# Patient Record
Sex: Male | Born: 1970 | Race: White | Hispanic: No | Marital: Married | Smoking: Former smoker
Health system: Southern US, Community
[De-identification: ages and names within clinical notes are randomized; demographics above are authoritative.]

## PROBLEM LIST (undated history)

## (undated) DIAGNOSIS — N200 Calculus of kidney: Secondary | ICD-10-CM

## (undated) DIAGNOSIS — K859 Acute pancreatitis without necrosis or infection, unspecified: Secondary | ICD-10-CM

## (undated) DIAGNOSIS — E119 Type 2 diabetes mellitus without complications: Secondary | ICD-10-CM

## (undated) DIAGNOSIS — M549 Dorsalgia, unspecified: Secondary | ICD-10-CM

## (undated) HISTORY — PX: ERCP: SHX60

## (undated) HISTORY — PX: PANCREAS SURGERY: SHX731

---

## 2004-07-28 ENCOUNTER — Emergency Department (HOSPITAL_COMMUNITY): Admission: EM | Admit: 2004-07-28 | Discharge: 2004-07-28 | Payer: Self-pay | Admitting: Emergency Medicine

## 2005-09-13 ENCOUNTER — Encounter: Admission: RE | Admit: 2005-09-13 | Discharge: 2005-09-13 | Payer: Self-pay | Admitting: Gastroenterology

## 2006-04-18 ENCOUNTER — Ambulatory Visit: Payer: Self-pay | Admitting: Physical Medicine & Rehabilitation

## 2006-04-18 ENCOUNTER — Encounter
Admission: RE | Admit: 2006-04-18 | Discharge: 2006-07-17 | Payer: Self-pay | Admitting: Physical Medicine & Rehabilitation

## 2006-08-11 ENCOUNTER — Encounter
Admission: RE | Admit: 2006-08-11 | Discharge: 2006-11-09 | Payer: Self-pay | Admitting: Physical Medicine & Rehabilitation

## 2006-11-03 ENCOUNTER — Ambulatory Visit: Payer: Self-pay | Admitting: Physical Medicine & Rehabilitation

## 2007-02-20 ENCOUNTER — Encounter
Admission: RE | Admit: 2007-02-20 | Discharge: 2007-02-20 | Payer: Self-pay | Admitting: Physical Medicine & Rehabilitation

## 2007-06-18 ENCOUNTER — Ambulatory Visit: Payer: Self-pay | Admitting: Physical Medicine & Rehabilitation

## 2007-06-18 ENCOUNTER — Encounter
Admission: RE | Admit: 2007-06-18 | Discharge: 2007-06-19 | Payer: Self-pay | Admitting: Physical Medicine & Rehabilitation

## 2007-11-11 ENCOUNTER — Encounter
Admission: RE | Admit: 2007-11-11 | Discharge: 2007-11-13 | Payer: Self-pay | Admitting: Physical Medicine & Rehabilitation

## 2007-11-13 ENCOUNTER — Ambulatory Visit: Payer: Self-pay | Admitting: Physical Medicine & Rehabilitation

## 2008-05-04 ENCOUNTER — Encounter
Admission: RE | Admit: 2008-05-04 | Discharge: 2008-05-05 | Payer: Self-pay | Admitting: Physical Medicine & Rehabilitation

## 2008-05-05 ENCOUNTER — Ambulatory Visit: Payer: Self-pay | Admitting: Physical Medicine & Rehabilitation

## 2010-10-02 NOTE — Assessment & Plan Note (Signed)
Curtis Spears returns to the clinic today for followup evaluation.  He  reports that he is having good relief from his pancreatic pain.  He  reports that he had a stent placed in December 2007 and that was removed  in March 2008.  He reports that he has only had one mild attack since  that time and that lasted approximately 3 days.  He took approximately  15 tablets over those 3 days but since that time has had no use of the  Percocet.  He reports that he would like to try Naprosyn on a regular  basis as he has had some benefit from that in terms of pressure in his  neck related to nerve pain previously.  He also reports that his blood  sugars have been mostly in the 100 range but occasionally as high as 300  when he has irregular diet or irregular sleeping patterns.  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   MEDICATIONS:  1. Aspirin 325 mg daily.  2. Pangestyme 20,000 units two tablets t.i.d.  3. Metformin 1000 mg daily.  4. Glimepiride 2 mg one tablet daily.  5. Oxycodone 10/325 one to two tablets p.o. q.i.d. p.r.n. (0-2 per      week).  6. Excedrin Migraine p.r.n.   REVIEW OF SYSTEMS:  Positive for weight gain, diarrhea, constipation,  and abdominal pain.   PHYSICAL EXAMINATION:  Well-appearing, fit adult male in mild to no  acute discomfort.  Blood pressure was 133/75 with a pulse of 68,  respiratory rate 18, and O2 saturation 99% on room air.  He has 5/5  strength throughout the bilateral upper and lower extremities.   IMPRESSION:  1. History of familial calcific pancreatitis with occasional      exacerbations.  2. Narcotic dependency during pancreatic episodes.   In the office today we did give the patient a new prescription for  Naprosyn 375 mg one tablet p.o. b.i.d. p.r.n.  We also refilled his  Percocet as he is running out of the last prescription given to him in  February.  We will plan on seeing the patient in  followup in  approximately 3-4 months' time.  He still is interested in possible  biofeedback in the future but will contact this office when his schedule  permits.           ______________________________  Curtis Spears, M.D.     DC/MedQ  D:  11/03/2006 11:52:51  T:  11/03/2006 14:11:26  Job #:  [id number]

## 2010-10-02 NOTE — Assessment & Plan Note (Signed)
Curtis Spears returns to clinic today for followup evaluation.  He reports  that he started to have severe back ache a few weeks ago especially  after a strong sneeze that he tried to hold back.  He reports that he  started to have spasms of his back muscles and eventually was seen by  [facility].  They apparently found persistent pars fracture  probably related to sports such as football that he had engaged it in  the past.  In an event, he was started on Skelaxin and continued on  oxycodone along with Naprosyn.  He reports that with the increased back  pain he has had to limit his traveling at his job, but he still works  full time.  He is attending therapy twice a week and that has helped  substantially.  The patient reports that he has had no bout of  pancreatitis recently.  He reports that his blood sugars remain elevated  and they are trying to adjust his medicines for that problem.   MEDICATIONS:  1. Aspirin 325 mg.  2. Pangestyme 20,000 units 2 tablets t.i.d.  3. Metformin 1000 mg b.i.d.  4. Glimepiride 2 mg 1-1/2 tablets daily.  5. Oxycodone 10/325, 1-2 tablets p.o. b.i.d. p.r.n.  6. Excedrin Migraine p.r.n.  7. Skelaxin 800 mg b.i.d.  8. Naprosyn 375 mg b.i.d.   REVIEW OF SYSTEMS:  Positive for constipation, abdominal pain, nausea,  high blood sugar, and weight gain.   PHYSICAL EXAMINATION:  GENERAL:  A reasonably well-appearing middle-aged  adult male in mild acute discomfort involving his back.  VITAL SIGNS:  Vitals were not obtained in the office today.  EXTREMITIES:  He has 5/5 strength throughout the bilateral upper and  lower extremities.  Lumbar range of motion was slightly decreased in  flexion and extension.   IMPRESSION:  1. History of familial  calcific pancreatitis with occasional      exacerbations.  2. Narcotic dependency during pancreatic episodes.  3. L5 pars fracture identified on recent x-ray.   At the present time, we have refilled the  patient's Skelaxin along with  his Naprosyn and oxycodone.  He continues to get good analgesic effect  from his oxycodone for his pancreatitis and now it is being helpful for  his back pain.  Additionally, the muscle relaxer and the  antiinflammatory medication along with therapy have helped.  He reports  that overall he is doing better than he was few weeks ago and plans to  resume his regular work activities over the next several weeks.  We will  plan on seeing the patient in followup in approximately 4-6 months' time  or before as necessary.           ______________________________  Curtis Spears, M.D.     DC/MedQ  D:  05/06/2008 11:19:01  T:  05/07/2008 02:57:47  Job #:  [id number]

## 2010-10-02 NOTE — Assessment & Plan Note (Signed)
Curtis Spears returns to the clinic today accompanied by his wife.  Overall, he is doing fairly well.  He has developed some increased mid  back pain, which his primary care physician did x-rays for.  He was told  that he had some arthritis, but he feels that it is more muscle related.  He does use a TENS unit and gets some benefit.  He had tried Flexeril,  but that made him too sleepy.   In terms of his pancreatitis, he last had an attack approximately 3-4  weeks ago and that lasted approximately 7 days.  He did have to use  approximately 6 Percocet per day during those episodes, but generally he  goes months without having to use the Percocet.  He reports that his  hemoglobin A1c was recently elevated at 11 and his diabetes medicines  were adjusted.  He is still feeling that he may eventually have to go on  insulin.  He does not need a refill on his Percocet in the office today.   MEDICATIONS:  1. Aspirin 325 mg daily.  2. Pangestyme 20,000 units 2 tablets t.i.d.  3. Metformin 1000 mg b.i.d.  4. Glimepiride 2 mg one and a half tablet daily.  5. Oxycodone 10/325 mg 1-2 tablets p.o. q.i.d. p.r.n. (0-2 per week).  6. Excedrin Migraine p.r.n.   REVIEW OF SYSTEMS:  Noncontributory.   PHYSICAL EXAMINATION:  GENERAL:  Well-appearing and well-nourished adult  male in mild-to-no acute discomfort.  VITAL SIGNS:  Not obtained in the office today.  NEUROLOGIC:  He has 5/5 strength throughout the bilateral upper and  lower extremities.  He ambulates without any assistive device.   IMPRESSION:  1. History of familial calcific pancreatitis with occasional      exacerbation.  2. Narcotic dependency during pancreatic episodes.  3. Mild mid back pain.   PLAN:  In the office today no refill on medication is necessary.  We did  give him samples of Skelaxin 800 mg to be used q.i.d. p.r.n.  He can try  this to see if he tolerates the medication and it gives him any benefit  from his back pain.   Otherwise, he will be using his TENS unit along  with heat for that problem.  He will call in for refill on his Percocet  as necessary.  We will plan on seeing him at followup in approximately 6  months' time.           ______________________________  Ellwood Dense, M.D.     DC/MedQ  D:  11/13/2007 12:25:31  T:  11/14/2007 16:10:96  Job #:  [id number]

## 2010-10-02 NOTE — Assessment & Plan Note (Signed)
DATE OF EVALUATION:  06/19/2007   Mr. Scalzo returns to clinic today for followup evaluation.  He was last  seen in this office November 03, 2006.  He has been continuing to [place] the  oxycodone 10/325 approximately 0-2 per week.  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  He had a small attack around Christmas time, but otherwise has  been doing well.  He is out of the oxycodone at this point, having last  filled that prescription approximately March 23, 2007.   The patient reports that his blood sugars have been reasonably well  controlled, although he is due to follow up with the physician's  assistant in Dr. Landry Dyke office over the next few weeks.   MEDICATIONS:  1. Aspirin 325 mg.  2. Pangestyme 20,000 units 2 tablets t.i.d..  3. Metformin 1000 mg daily.  4. Glimepiride 2 mg 1 tablet.  5. Oxycodone 10/325 one to two tablets p.o. q.i.d. p.r.n. (0-2 per      week).  6. Excedrin Migraine p.r.n.   REVIEW OF SYSTEMS:  Noncontributory.   PHYSICAL EXAMINATION:  Well appearing, thin, adult male in mild to no  acute discomfort.  Blood pressure 150/67, pulse 93, respiratory rate 18  and O2 saturation 97% on room air.  He has 5/5 strength throughout the  bilateral upper and lower extremities.   IMPRESSION:  1. History of familial calcific pancreatitis with occasional      exacerbations.  2. Narcotic dependency during pancreatic episodes.   In the office today, we did refill the patient's oxycodone 10/325, a  total of 120 as of today, June 19, 2007.  We will plan on seeing him  in followup in approximately six moths' time.  He continues to get good  analgesic effect when he actually needs the medicine and he has few  exacerbations at present.  He has had no hospitalizations over the past  several months.  We will plan on seeing him in followup as noted above.           ______________________________  Ellwood Dense,  M.D.     DC/MedQ  D:  06/19/2007 12:41:02  T:  06/19/2007 16:10:96  Job #:  [id number]

## 2010-10-05 NOTE — Group Therapy Note (Signed)
Friday, April 18, 2006:   Purpose of evaluation:  Evaluate and treat chronic pancreatic pain.   HISTORY OF PRESENT ILLNESS:  Curtis Spears is a 40 year old adult male  referred to this office by his GI specialist for evaluation and  treatment of chronic pancreatic pain. The patient reports a history of  pancreatic pain since he was a small child, and also there is a strong  family history of hereditary familial pancreatitis. His father has  suffered for years and underwent an pancreatectomy in the recent past.  The patient has been followed by Dr. Evette Cristal and occasionally Dr. Fredric Mare  for periodic ERCPs. These procedures have been done for stone  extractions starting in 1991, 1998, and most recently in 2004.   The patient reports that his episodes generally last 1 1/2 to 2 weeks at  the present time. They occur every two to three months. On days when he  is not having attacks, he is able to function well, taking his  pancreatic enzymes and working outside the home. On times when he has on  days, when he has attacks, he generally takes up to eight tablets of the  Percocet in a 24-hour period. He generally has a total of 60 tablets  that he gets filled for him previously by the treating physicians. He  tries to avoid any exacerbating events of foods that would bring on his  pancreatic attacks. He seems to have a good understanding now that he  has suffered with this for two to three decades.   PAST MEDICAL HISTORY:  1. History of chronic familial calcific pancreatitis followed by Dr.      Evette Cristal and Dr. Fredric Mare, diagnosed in 1970s at [facility].  2. Non-insulin dependent diabetes mellitus., Secondary to hereditary      pancreatitis.  3. History of multiple endoscopic stone extractions x3 in 1991, 1998,      and 2004.   FAMILY HISTORY:  Positive for familial pancreatitis with his father  having undergone a pancreatectomy.   ALLERGIES:  PENICILLIN.   SOCIAL HISTORY:  The patient is  married with one stepdaughter. He quit  tobacco one year ago and uses chewing gum to avoid recurrent use. He  reports rare use of alcohol in the past, with no use at present. He does  smoke a rare cigar. 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   MEDICAL DECISION MAKING:  1. Aspirin 250 mg one tablet q.d.  2. Pangestyme 20,000 units two tablets t.i.d.  3. Metformin 1000 mg two tablets q.d.  4. Glimepiride 2 mg one tablet q.d.  5. Oxycodone 10/325 one to two tablets p.o. q. 4 hours p.r.n.  6. Excedrin Migraine p.r.n.   REVIEW OF SYSTEMS:  Positive for occasional diarrhea, constipation and  abdominal pain.   PHYSICAL EXAMINATION:  Well-appearing, fit adult male, height 5 foot 11  inches, weight 215 pounds. Blood pressure is 133/60 with a pulse of 75,  respiratory rate 16, and O2 saturation 97% on room air. He has 5/5  strength throughout the bilateral upper and lower extremities. Bulk and  tone were normal. Reflexes were 2+ and symmetrical. Upper extremities  and lower extremities range of motion was full and pain-free.   IMPRESSION:  1. History of familial calcific pancreatitis with occasional      exacerbations.  2. Narcotic dependency doing pancreatitis episodes.   At the present time, I have refilled the patient's oxycodone 10/325 one  to two tablets p.o. q.i.d.  p.r.n. He requests only a total of 60 to be  available to him at the present time. He understands that he needs to  get all pain medicines through this office and that we will refill that  as needed. I asked him to give [place] several days notice so that we can  make sure he has a sufficient supply, especially when he is out on  business trips. We will plan on seeing the patient in followup in  approximately two month's time, and we will continue medications at that  point. He also is interested in possibly looking into biofeedback, which  he has used in the past. I will contact Dr. Leonides Cave when Dr.  Leonides Cave  returns from his knee replacement surgery. We will see if any  biofeedback is available in the area. The patient has been advised about  use of laxatives secondary to constipation that he develops with the use  of the Percocet. I have also encouraged him to try to maintain fluid  intake, especially when he is having pancreatic episodes. He seems to be  able to eat minimal amounts of soft food, but his p.o. intake fluids is  somewhat limited. I have encouraged him to try to keep that fluid intake  up, especially when he starts taking the Percocet. We will plan on  seeing the patient in followup in approximately two month's time.           ______________________________  Ellwood Dense, M.D.     DC/MedQ  D:  04/18/2006 13:43:36  T:  04/18/2006 15:22:40  Job #:  [id number]

## 2012-04-13 ENCOUNTER — Emergency Department (HOSPITAL_BASED_OUTPATIENT_CLINIC_OR_DEPARTMENT_OTHER): Payer: 59

## 2012-04-13 ENCOUNTER — Inpatient Hospital Stay (HOSPITAL_BASED_OUTPATIENT_CLINIC_OR_DEPARTMENT_OTHER)
Admission: EM | Admit: 2012-04-13 | Discharge: 2012-04-19 | DRG: 438 | Disposition: A | Discharge status: Home / Self Care | Payer: 59 | Attending: Internal Medicine | Admitting: Internal Medicine

## 2012-04-13 ENCOUNTER — Encounter (HOSPITAL_BASED_OUTPATIENT_CLINIC_OR_DEPARTMENT_OTHER): Payer: Self-pay | Admitting: *Deleted

## 2012-04-13 DIAGNOSIS — G92 Toxic encephalopathy: Secondary | ICD-10-CM | POA: Diagnosis not present

## 2012-04-13 DIAGNOSIS — J9601 Acute respiratory failure with hypoxia: Secondary | ICD-10-CM

## 2012-04-13 DIAGNOSIS — IMO0002 Reserved for concepts with insufficient information to code with codable children: Secondary | ICD-10-CM | POA: Diagnosis present

## 2012-04-13 DIAGNOSIS — Z880 Allergy status to penicillin: Secondary | ICD-10-CM

## 2012-04-13 DIAGNOSIS — K861 Other chronic pancreatitis: Secondary | ICD-10-CM | POA: Diagnosis present

## 2012-04-13 DIAGNOSIS — Z91041 Radiographic dye allergy status: Secondary | ICD-10-CM

## 2012-04-13 DIAGNOSIS — Z79899 Other long term (current) drug therapy: Secondary | ICD-10-CM

## 2012-04-13 DIAGNOSIS — G929 Unspecified toxic encephalopathy: Secondary | ICD-10-CM | POA: Diagnosis not present

## 2012-04-13 DIAGNOSIS — E876 Hypokalemia: Secondary | ICD-10-CM | POA: Diagnosis present

## 2012-04-13 DIAGNOSIS — G4733 Obstructive sleep apnea (adult) (pediatric): Secondary | ICD-10-CM | POA: Diagnosis present

## 2012-04-13 DIAGNOSIS — G9340 Encephalopathy, unspecified: Secondary | ICD-10-CM

## 2012-04-13 DIAGNOSIS — T40605A Adverse effect of unspecified narcotics, initial encounter: Secondary | ICD-10-CM | POA: Diagnosis not present

## 2012-04-13 DIAGNOSIS — R000 Tachycardia, unspecified: Secondary | ICD-10-CM | POA: Diagnosis present

## 2012-04-13 DIAGNOSIS — E119 Type 2 diabetes mellitus without complications: Secondary | ICD-10-CM | POA: Diagnosis present

## 2012-04-13 DIAGNOSIS — Z794 Long term (current) use of insulin: Secondary | ICD-10-CM

## 2012-04-13 DIAGNOSIS — K859 Acute pancreatitis without necrosis or infection, unspecified: Principal | ICD-10-CM | POA: Diagnosis present

## 2012-04-13 DIAGNOSIS — J9600 Acute respiratory failure, unspecified whether with hypoxia or hypercapnia: Secondary | ICD-10-CM | POA: Diagnosis not present

## 2012-04-13 DIAGNOSIS — Z87891 Personal history of nicotine dependence: Secondary | ICD-10-CM

## 2012-04-13 DIAGNOSIS — K8689 Other specified diseases of pancreas: Secondary | ICD-10-CM | POA: Diagnosis present

## 2012-04-13 HISTORY — DX: Dorsalgia, unspecified: M54.9

## 2012-04-13 HISTORY — DX: Type 2 diabetes mellitus without complications: E11.9

## 2012-04-13 HISTORY — DX: Acute pancreatitis without necrosis or infection, unspecified: K85.90

## 2012-04-13 LAB — CBC WITH DIFFERENTIAL/PLATELET
Basophils Absolute: 0 10*3/uL (ref 0.0–0.1)
Basophils Relative: 0 % (ref 0–1)
Eosinophils Absolute: 0.3 10*3/uL (ref 0.0–0.7)
Eosinophils Relative: 3 % (ref 0–5)
Lymphs Abs: 1.7 10*3/uL (ref 0.7–4.0)
Monocytes Relative: 9 % (ref 3–12)
Neutro Abs: 9 10*3/uL — ABNORMAL HIGH (ref 1.7–7.7)
WBC: 12.1 10*3/uL — ABNORMAL HIGH (ref 4.0–10.5)

## 2012-04-13 LAB — COMPREHENSIVE METABOLIC PANEL
Chloride: 104 mEq/L (ref 96–112)
Creatinine, Ser: 1 mg/dL (ref 0.50–1.35)
GFR calc non Af Amer: >90 mL/min (ref >90)
Total Bilirubin: 0.8 mg/dL (ref 0.3–1.2)
Total Protein: 7.3 g/dL (ref 6.0–8.3)

## 2012-04-13 LAB — URINALYSIS, ROUTINE W REFLEX MICROSCOPIC
Glucose, UA: NEGATIVE mg/dL
Specific Gravity, Urine: 1.029 (ref 1.005–1.030)

## 2012-04-13 LAB — LACTIC ACID, PLASMA: Lactic Acid, Venous: 6 mmol/L — ABNORMAL HIGH (ref 0.5–2.2)

## 2012-04-13 LAB — URINE MICROSCOPIC-ADD ON

## 2012-04-13 LAB — GLUCOSE, CAPILLARY: Glucose-Capillary: 107 mg/dL — ABNORMAL HIGH (ref 70–99)

## 2012-04-13 MED ORDER — SODIUM CHLORIDE 0.9 % IV BOLUS (SEPSIS)
1000.0000 mL | Freq: Once | INTRAVENOUS | Status: DC
  Administered 2012-04-14: 1000 mL via INTRAVENOUS

## 2012-04-13 MED ORDER — SODIUM CHLORIDE 0.9 % IV BOLUS (SEPSIS)
1000.0000 mL | Freq: Once | INTRAVENOUS | Status: AC
Start: 2012-04-13 — End: 2012-04-14
  Administered 2012-04-13: 1000 mL via INTRAVENOUS

## 2012-04-13 MED ORDER — HYDROMORPHONE HCL PF 1 MG/ML IJ SOLN
1.0000 mg | Freq: Once | INTRAMUSCULAR | Status: AC
  Administered 2012-04-13: 1 mg via INTRAVENOUS
  Filled 2012-04-13: qty 1

## 2012-04-13 MED ORDER — ONDANSETRON HCL 4 MG/2ML IJ SOLN
4.0000 mg | Freq: Once | INTRAMUSCULAR | Status: AC
  Administered 2012-04-13: 4 mg via INTRAVENOUS
  Filled 2012-04-13: qty 2

## 2012-04-13 MED ORDER — IOHEXOL 300 MG/ML  SOLN: 25.0000 mL | INTRAMUSCULAR | Status: AC

## 2012-04-13 MED ORDER — SODIUM CHLORIDE 0.9 % IV SOLN
500.0000 mg | Freq: Once | INTRAVENOUS | Status: AC
  Administered 2012-04-13: 500 mg via INTRAVENOUS
  Filled 2012-04-13: qty 500

## 2012-04-13 MED ORDER — ONDANSETRON HCL 4 MG/2ML IJ SOLN
4.0000 mg | Freq: Three times a day (TID) | INTRAMUSCULAR | Status: DC | PRN
  Administered 2012-04-13: 4 mg via INTRAVENOUS
  Filled 2012-04-13: qty 2

## 2012-04-13 MED ORDER — HYDROMORPHONE HCL PF 1 MG/ML IJ SOLN
1.0000 mg | INTRAMUSCULAR | Status: DC | PRN
  Administered 2012-04-13: 1 mg via INTRAVENOUS
  Filled 2012-04-13: qty 1

## 2012-04-13 MED ORDER — SODIUM CHLORIDE 0.9 % IV BOLUS (SEPSIS): 1000.0000 mL | Freq: Once | INTRAVENOUS | Status: DC

## 2012-04-13 MED ORDER — SODIUM CHLORIDE 0.9 % IV SOLN: INTRAVENOUS | Status: DC

## 2012-04-13 MED ORDER — SODIUM CHLORIDE 0.9 % IV BOLUS (SEPSIS)
1000.0000 mL | Freq: Once | INTRAVENOUS | Status: AC
  Administered 2012-04-13: 1000 mL via INTRAVENOUS

## 2012-04-13 MED ORDER — KETOROLAC TROMETHAMINE 30 MG/ML IJ SOLN
30.0000 mg | Freq: Once | INTRAMUSCULAR | Status: AC
  Administered 2012-04-13: 30 mg via INTRAVENOUS
  Filled 2012-04-13: qty 1

## 2012-04-13 NOTE — ED Notes (Signed)
States he has a hereditary pancreatitis. Abdominal pain x 1.5 hours.

## 2012-04-13 NOTE — ED Provider Notes (Signed)
History  This chart was scribed for Curtis Octave, MD by Curtis Spears, ED Scribe. This patient was seen in room MH07/MH07 and the patient's care was started at 1840.   CSN: [id number]  Arrival date & time 04/13/12  4098   First MD Initiated Contact with Patient 04/13/12 1840      Chief Complaint  Patient presents with  . Abdominal Pain    The history is provided by the patient. No language interpreter was used.    Curtis Spears is a 41 y.o. male who presents to the Emergency Department complaining of sudden onset, quickly;y worsening abdominal pain that started 1.5 hours ago. He states that he has had similar problems over the past 2 years, but nothing this severe since February. He states that the pain has never started this quickly and this severely in the past. He denies nausea, emesis, testicle pain and problems with his bowels as associated symptoms. He denies any recent action that could have caused the pain. He has a h/o pancreatitis, DM and back pain. Pt is a former smoker but denies alcohol use.    Past Medical History  Diagnosis Date  . Pancreatitis   . Diabetes mellitus without complication   . Back pain     History reviewed. No pertinent past surgical history.  No family history on file.  History  Substance Use Topics  . Smoking status: Former Games developer  . Smokeless tobacco: Not on file  . Alcohol Use: No      Review of Systems  All other systems reviewed and are negative.   A complete 10 system review of systems was obtained and all systems are negative except as noted in the HPI and PMH.    Allergies  Ivp dye and Penicillins  Home Medications   Current Outpatient Rx  Name  Route  Sig  Dispense  Refill  . CYMBALTA PO   Oral   Take by mouth.         . METFORMIN HCL PO   Oral   Take by mouth.           Triage Vitals: BP 175/110  Pulse 144  Temp 97.9 F (36.6 C) (Oral)  Resp 20  SpO2 95%  Physical Exam  Nursing note and  vitals reviewed. Constitutional: He is oriented to person, place, and time. He appears well-developed and well-nourished. No distress.       Uncomfortable   HENT:  Head: Normocephalic and atraumatic.  Eyes: EOM are normal. Pupils are equal, round, and reactive to light.  Neck: Normal range of motion. Neck supple. No tracheal deviation present.  Cardiovascular: Normal rate and normal heart sounds.        +2 femoral, DP, PT pulses. Tachycardic.  Pulmonary/Chest: Effort normal and breath sounds normal. No respiratory distress.  Abdominal: Soft. He exhibits no distension. There is tenderness. There is guarding.       Tenderness to epigastric area, insulin pump to RUQ, reducible umbilical hernia   Musculoskeletal: Normal range of motion. He exhibits no edema.  Neurological: He is alert and oriented to person, place, and time. No cranial nerve deficit. He exhibits normal muscle tone. Coordination normal.  Skin: Skin is warm. He is diaphoretic.  Psychiatric: He has a normal mood and affect. His behavior is normal.    ED Course  Procedures (including critical care time)  DIAGNOSTIC STUDIES: Oxygen Saturation is 95% on room air, adequate by my interpretation.    COORDINATION OF CARE:  6:41  PM: Discussed treatment plan which includes pain medication with pt at bedside and pt agreed to plan.    Labs Reviewed  CBC WITH DIFFERENTIAL - Abnormal; Notable for the following:    WBC 12.1 (*)     Neutro Abs 9.0 (*)     All other components within normal limits  COMPREHENSIVE METABOLIC PANEL - Abnormal; Notable for the following:    Glucose, Bld 124 (*)     All other components within normal limits  LIPASE, BLOOD - Abnormal; Notable for the following:    Lipase 7 (*)     All other components within normal limits  LACTIC ACID, PLASMA - Abnormal; Notable for the following:    Lactic Acid, Venous 6.0 (*)     All other components within normal limits  URINALYSIS, ROUTINE W REFLEX MICROSCOPIC -  Abnormal; Notable for the following:    Color, Urine AMBER (*)  BIOCHEMICALS MAY BE AFFECTED BY COLOR   Hgb urine dipstick TRACE (*)     Ketones, ur 15 (*)     Protein, ur 30 (*)     All other components within normal limits  URINE MICROSCOPIC-ADD ON - Abnormal; Notable for the following:    Crystals CA OXALATE CRYSTALS (*)     All other components within normal limits  AMYLASE  TROPONIN I   Dg Abd Acute W/chest  04/13/2012  *RADIOLOGY REPORT*  Clinical Data: Sudden onset severe abdominal pain.  ACUTE ABDOMEN SERIES (ABDOMEN 2 VIEW & CHEST 1 VIEW)  Comparison: None.  Findings: Frontal view of the chest shows midline trachea and normal heart size.  Calcified granuloma in the right lower lobe. Lungs are otherwise clear.  No pleural fluid.  Two views of the abdomen show gas and stool in the colon.  There may be a few associated air fluid levels.  No small bowel dilatation. A battery pack device projects over the lateral right abdomen.  IMPRESSION: Bowel gas pattern is nonspecific.  No evidence of overt obstruction.   Original Report Authenticated By: Curtis Spears, M.D.      No diagnosis found.    MDM  History of pancreatitis presenting with typical epigastric pain the back for the past 90 minutes. Associated with nausea. Reports extensive history of multiple ERCPs done in [place]. Symptoms have been well controlled with Cymbalta.  Lipase 7.  AAS negative.  Patient very uncomfortable, diaphoretic, writhing around bed.  Lactate 6. WBC 12.  Given IVF, analgesics, antiemetics.  Remains uncomfortable.  CT findings of pancreatitis with pancreatitc duct stone.  D/w Dr. Toniann Fail who will admit patient to [facility].  GI to consult.  Repeat lactate sent.  D/w Dr. Leone Payor of  GI.  He will evaluate patient in the morning.  Does not recommend antibiotics at this point.  Patient may need tertiary evaluation after flare calms down.   Date: 04/13/2012  Rate: 117  Rhythm: sinus tachycardia   QRS Axis: normal  Intervals: normal  ST/T Wave abnormalities: normal  Conduction Disutrbances:none  Narrative Interpretation:   Old EKG Reviewed: none available  CRITICAL CARE Performed by: Curtis Spears   Total critical care time: 30  Critical care time was exclusive of separately billable procedures and treating other patients.  Critical care was necessary to treat or prevent imminent or life-threatening deterioration.  Critical care was time spent personally by me on the following activities: development of treatment plan with patient and/or surrogate as well as nursing, discussions with consultants, evaluation of patient's response to treatment, examination of  patient, obtaining history from patient or surrogate, ordering and performing treatments and interventions, ordering and review of laboratory studies, ordering and review of radiographic studies, pulse oximetry and re-evaluation of patient's condition.    I personally performed the services described in this documentation, which was scribed in my presence. The recorded information has been reviewed and is accurate.    Curtis Octave, MD 04/13/12 [phone]

## 2012-04-14 ENCOUNTER — Encounter (HOSPITAL_COMMUNITY): Payer: Self-pay | Admitting: *Deleted

## 2012-04-14 ENCOUNTER — Inpatient Hospital Stay (HOSPITAL_COMMUNITY): Payer: 59

## 2012-04-14 DIAGNOSIS — J9601 Acute respiratory failure with hypoxia: Secondary | ICD-10-CM

## 2012-04-14 DIAGNOSIS — E119 Type 2 diabetes mellitus without complications: Secondary | ICD-10-CM | POA: Diagnosis present

## 2012-04-14 DIAGNOSIS — K8689 Other specified diseases of pancreas: Secondary | ICD-10-CM

## 2012-04-14 DIAGNOSIS — G9340 Encephalopathy, unspecified: Secondary | ICD-10-CM

## 2012-04-14 DIAGNOSIS — J9600 Acute respiratory failure, unspecified whether with hypoxia or hypercapnia: Secondary | ICD-10-CM

## 2012-04-14 DIAGNOSIS — K859 Acute pancreatitis without necrosis or infection, unspecified: Secondary | ICD-10-CM

## 2012-04-14 LAB — BASIC METABOLIC PANEL
CO2: 27 mEq/L (ref 19–32)
Glucose, Bld: 103 mg/dL — ABNORMAL HIGH (ref 70–99)
Potassium: 3.1 mEq/L — ABNORMAL LOW (ref 3.5–5.1)
Sodium: 142 mEq/L (ref 135–145)

## 2012-04-14 LAB — CBC WITH DIFFERENTIAL/PLATELET
Eosinophils Relative: 2 % (ref 0–5)
HCT: 36.2 % — ABNORMAL LOW (ref 39.0–52.0)
Lymphocytes Relative: 19 % (ref 12–46)
Lymphs Abs: 1.5 10*3/uL (ref 0.7–4.0)
MCV: 87.4 fL (ref 78.0–100.0)
Monocytes Absolute: 0.8 10*3/uL (ref 0.1–1.0)
Neutro Abs: 5 10*3/uL (ref 1.7–7.7)
Platelets: 123 10*3/uL — ABNORMAL LOW (ref 150–400)
RBC: 4.14 MIL/uL — ABNORMAL LOW (ref 4.22–5.81)
WBC: 7.5 10*3/uL (ref 4.0–10.5)

## 2012-04-14 LAB — HEPATIC FUNCTION PANEL
ALT: 24 U/L (ref 0–53)
AST: 25 U/L (ref 0–37)
Alkaline Phosphatase: 70 U/L (ref 39–117)
Indirect Bilirubin: 0.4 mg/dL (ref 0.3–0.9)
Total Protein: 5.5 g/dL — ABNORMAL LOW (ref 6.0–8.3)

## 2012-04-14 LAB — GLUCOSE, CAPILLARY
Glucose-Capillary: 74 mg/dL (ref 70–99)
Glucose-Capillary: 102 mg/dL — ABNORMAL HIGH (ref 70–99)
Glucose-Capillary: 115 mg/dL — ABNORMAL HIGH (ref 70–99)

## 2012-04-14 LAB — LACTIC ACID, PLASMA: Lactic Acid, Venous: 0.6 mmol/L (ref 0.5–2.2)

## 2012-04-14 LAB — MRSA PCR SCREENING: MRSA by PCR: NEGATIVE

## 2012-04-14 MED ORDER — POTASSIUM CHLORIDE 10 MEQ/100ML IV SOLN
10.0000 meq | INTRAVENOUS | Status: AC
  Administered 2012-04-14 (×4): 10 meq via INTRAVENOUS
  Filled 2012-04-14: qty 300
  Filled 2012-04-14: qty 100

## 2012-04-14 MED ORDER — SODIUM CHLORIDE 0.9 % IJ SOLN
3.0000 mL | Freq: Two times a day (BID) | INTRAMUSCULAR | Status: DC
  Administered 2012-04-14: 3 mL via INTRAVENOUS

## 2012-04-14 MED ORDER — WHITE PETROLATUM GEL
Status: AC
  Administered 2012-04-14: 11:00:00
  Filled 2012-04-14: qty 5

## 2012-04-14 MED ORDER — INSULIN ASPART 100 UNIT/ML ~~LOC~~ SOLN: 0.0000 [IU] | Freq: Three times a day (TID) | SUBCUTANEOUS | Status: DC

## 2012-04-14 MED ORDER — SODIUM CHLORIDE 0.9 % IJ SOLN: 9.0000 mL | INTRAMUSCULAR | Status: DC | PRN

## 2012-04-14 MED ORDER — KETOROLAC TROMETHAMINE 30 MG/ML IJ SOLN
30.0000 mg | Freq: Three times a day (TID) | INTRAMUSCULAR | Status: DC
  Administered 2012-04-14: 30 mg via INTRAVENOUS
  Filled 2012-04-14 (×3): qty 1

## 2012-04-14 MED ORDER — NALOXONE HCL 0.4 MG/ML IJ SOLN: 0.4000 mg | INTRAMUSCULAR | Status: DC | PRN

## 2012-04-14 MED ORDER — POTASSIUM CHLORIDE 10 MEQ/100ML IV SOLN
10.0000 meq | INTRAVENOUS | Status: AC
  Administered 2012-04-14 – 2012-04-15 (×4): 10 meq via INTRAVENOUS
  Filled 2012-04-14 (×4): qty 100

## 2012-04-14 MED ORDER — HYDROMORPHONE HCL PF 1 MG/ML IJ SOLN
2.0000 mg | INTRAMUSCULAR | Status: DC | PRN
  Administered 2012-04-14: 2 mg via INTRAMUSCULAR
  Filled 2012-04-14: qty 2

## 2012-04-14 MED ORDER — SODIUM CHLORIDE 0.9 % IV SOLN
INTRAVENOUS | Status: DC
  Administered 2012-04-14 (×3): via INTRAVENOUS
  Administered 2012-04-14 (×3): 250 mL/h via INTRAVENOUS
  Administered 2012-04-15: 1000 mL via INTRAVENOUS
  Administered 2012-04-15: 250 mL/h via INTRAVENOUS
  Administered 2012-04-15: 20:00:00 via INTRAVENOUS
  Administered 2012-04-15: 1000 mL via INTRAVENOUS
  Administered 2012-04-16 (×2): via INTRAVENOUS

## 2012-04-14 MED ORDER — ACETAMINOPHEN 325 MG PO TABS: 650.0000 mg | ORAL_TABLET | Freq: Four times a day (QID) | ORAL | Status: DC | PRN

## 2012-04-14 MED ORDER — HYDROMORPHONE HCL PF 1 MG/ML IJ SOLN
1.0000 mg | INTRAMUSCULAR | Status: DC | PRN
  Administered 2012-04-15 – 2012-04-17 (×18): 1 mg via INTRAVENOUS
  Filled 2012-04-14 (×18): qty 1

## 2012-04-14 MED ORDER — HYDROMORPHONE HCL PF 1 MG/ML IJ SOLN
1.0000 mg | INTRAMUSCULAR | Status: DC | PRN
  Administered 2012-04-14: 1 mg via INTRAMUSCULAR
  Filled 2012-04-14: qty 1

## 2012-04-14 MED ORDER — DIPHENHYDRAMINE HCL 12.5 MG/5ML PO ELIX
12.5000 mg | ORAL_SOLUTION | Freq: Four times a day (QID) | ORAL | Status: DC | PRN
  Filled 2012-04-14: qty 5

## 2012-04-14 MED ORDER — ONDANSETRON HCL 4 MG/2ML IJ SOLN: 4.0000 mg | Freq: Four times a day (QID) | INTRAMUSCULAR | Status: DC | PRN

## 2012-04-14 MED ORDER — PANTOPRAZOLE SODIUM 40 MG IV SOLR
40.0000 mg | Freq: Two times a day (BID) | INTRAVENOUS | Status: DC
  Administered 2012-04-14: 40 mg via INTRAVENOUS
  Filled 2012-04-14 (×2): qty 40

## 2012-04-14 MED ORDER — HYDROMORPHONE HCL PF 1 MG/ML IJ SOLN
2.0000 mg | INTRAMUSCULAR | Status: DC | PRN
  Administered 2012-04-14: 2 mg via INTRAVENOUS

## 2012-04-14 MED ORDER — ACETAMINOPHEN 10 MG/ML IV SOLN
1000.0000 mg | Freq: Four times a day (QID) | INTRAVENOUS | Status: DC
  Filled 2012-04-14 (×3): qty 100

## 2012-04-14 MED ORDER — DIPHENHYDRAMINE HCL 50 MG/ML IJ SOLN: 12.5000 mg | Freq: Four times a day (QID) | INTRAMUSCULAR | Status: DC | PRN

## 2012-04-14 MED ORDER — ONDANSETRON HCL 4 MG/2ML IJ SOLN
4.0000 mg | Freq: Four times a day (QID) | INTRAMUSCULAR | Status: DC | PRN
  Administered 2012-04-15: 4 mg via INTRAVENOUS
  Filled 2012-04-14: qty 2

## 2012-04-14 MED ORDER — HYDROMORPHONE HCL PF 1 MG/ML IJ SOLN
INTRAMUSCULAR | Status: AC
  Administered 2012-04-14: 2 mg via INTRAVENOUS
  Filled 2012-04-14: qty 2

## 2012-04-14 MED ORDER — INSULIN ASPART 100 UNIT/ML ~~LOC~~ SOLN
0.0000 [IU] | SUBCUTANEOUS | Status: DC
  Administered 2012-04-15 – 2012-04-16 (×3): 3 [IU] via SUBCUTANEOUS

## 2012-04-14 MED ORDER — HYDROMORPHONE HCL PF 1 MG/ML IJ SOLN
1.0000 mg | INTRAMUSCULAR | Status: DC | PRN
  Administered 2012-04-14 (×2): 1 mg via INTRAVENOUS
  Filled 2012-04-14 (×3): qty 1

## 2012-04-14 MED ORDER — ACETAMINOPHEN 650 MG RE SUPP: 650.0000 mg | Freq: Four times a day (QID) | RECTAL | Status: DC | PRN

## 2012-04-14 MED ORDER — ONDANSETRON HCL 4 MG PO TABS: 4.0000 mg | ORAL_TABLET | Freq: Four times a day (QID) | ORAL | Status: DC | PRN

## 2012-04-14 MED ORDER — HYDROMORPHONE 0.3 MG/ML IV SOLN
INTRAVENOUS | Status: DC
  Administered 2012-04-14: 0.6 mg via INTRAVENOUS
  Administered 2012-04-14: 10:00:00 via INTRAVENOUS
  Filled 2012-04-14: qty 25

## 2012-04-14 MED ORDER — HEPARIN SODIUM (PORCINE) 5000 UNIT/ML IJ SOLN
5000.0000 [IU] | Freq: Three times a day (TID) | INTRAMUSCULAR | Status: DC
  Administered 2012-04-14 – 2012-04-19 (×14): 5000 [IU] via SUBCUTANEOUS
  Filled 2012-04-14 (×17): qty 1

## 2012-04-14 NOTE — Progress Notes (Signed)
Bladder scan done, 263cc noted in bladder.  Dr. Butler Denmark here and notified.  Pt remains in severe pain after 2mg  IV dilaudid.  Order to start PCA dilaudid.  Waiting for CO2 monitor to start.

## 2012-04-14 NOTE — Progress Notes (Addendum)
[facility] Progress Note [place] TEAM 1 - Stepdown/ICU TEAM   MONTRELL CESSNA WUJ:[id number] DOB: Nov 09, 1970 DOA: 04/13/2012 PCP: No primary provider on file.  Brief narrative: * Chief Complaint: Abdominal pain.  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 Had some nausea but denies any vomiting or diarrhea. The pain was mainly in the epigastric area radiating to both his flank area. The pain was typical of his pancreatitis. He came to the ER at [facility] and CAT scan showed features of pancreatitis with a pancreatic duct stone. At this time ER physician Dr. Manus Gunning had discussed with Dr. Leone Payor and at this time patient will be admitted for further management. Patient's initial lipase levels were high and lactic acid also was high. His blood pressure improved with fluids in the ER.   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 : * Patient continues to have severe right quadrant pain secondary to pancreatic duct stone in the head of the pancreas. * Patient's blood pressure has stabilized with 3 L IV fluid in the emergency room and an additional 2 L of fluid since admission to the step down bed. * Continue IV fluids at 250 cc an hour. * Monitor urine output carefully. * Appreciate Dr. Luan Moore Assistance. * Continue supportive care. * Clear Liquid Diet per Dr. Evette Cristal. * I have spoken with Dr.Evans (GI)- through the transfer center- [phone] [facility]- he advised that if he does not improve over next 24 - 48 hours then we can call back to request transfer for ERCP- currently they have no beds.      Active Problems:  * Pain Management: * Pt writhing with pain and  profoundly diaphoretic this morning. *States pain radiates from the  right upper quadrant.  * Pt. rates his pain this morning at 6-7 despite Dilaudid IV. * PCA Dilaudid added in addition to IM dilaudid in attempt to better manage pain. * Toradol added this afternoon for continued pain and diaphoresis. * Patient denies any chest pain or shortness of breath. * Continue to monitor for improvement in pain.    * Hypokalemia:  * Potassium this morning was 3.1 . * 4 runs of 10 mEq each ordered IV to replete. * BMET and Mg+ in am   *  Diabetes mellitus: * Blood Sugars are well controlled at present. * Continue CBG's Q 4 hours with SSI as needed.    DVT prophylaxis: SCD's Code Status: Full Family Communication: Spoke with patient directly at bedside Disposition Plan: Transfer to  Medical surgical bed  Consultants: * GI    Procedures: * None     Antibiotics: * Primaxin  04/13/12>>>>   HPI/Subjective: Patient states he is in a lot of pain and that this was a very sudden onset of symptoms unlike his previous bouts of pancreatitis which he could anticipate the event a few days in advance. He states that being on Cymbalta has helped a great deal with managing pain at home.     Objective: Blood pressure 130/89, pulse 110, temperature 98.5 F (36.9 C), temperature source Oral, resp. rate 19, height 5\' 11"  (1.803 m), weight 97.9 kg (215 lb 13.3 oz), SpO2 95.00%.  Intake/Output Summary (Last 24 hours) at 04/14/12 1439 Last data filed at 04/14/12 1300  Gross per 24 hour  Intake  3922 ml  Output    600 ml  Net   3322 ml     Exam: General: No acute respiratory distress but diaphoretic and writhing with pain . Lungs: Clear to auscultation bilaterally without wheezes or crackles Cardiovascular: Regular rate and rhythm without murmur gallop or rub normal S1 and S2. Tachycardic due to pain Abdomen: Right upper quadrant tenderness, nondistended, soft with guarding noted, bowel sounds positive, no rebound, no ascites, no appreciable mass Extremities:  No significant cyanosis, clubbing, or edema bilateral lower extremities  Data Reviewed: Basic Metabolic Panel:  Lab 04/14/12 4782 04/13/12 1839  NA 142 142  K 3.1* 3.6  CL 108 104  CO2 27 25  GLUCOSE 103* 124*  BUN 15 14  CREATININE 0.86 1.00  CALCIUM 8.1* 9.3  MG -- --  PHOS -- --   Liver Function Tests:  Lab 04/14/12 0144 04/13/12 1839  AST 25 32  ALT 24 35  ALKPHOS 70 91  BILITOT 0.5 0.8  PROT 5.5* 7.3  ALBUMIN 3.3* 4.1    Lab 04/13/12 1850 04/13/12 1839  LIPASE -- 7*  AMYLASE 16 --   No results found for this basename: AMMONIA:5 in the last 168 hours CBC:  Lab 04/14/12 0144 04/13/12 1839  WBC 7.5 12.1*  NEUTROABS 5.0 9.0*  HGB 12.4* 16.0  HCT 36.2* 45.5  MCV 87.4 86.3  PLT 123* 175   Cardiac Enzymes:  Lab 04/13/12 2004  CKTOTAL --  CKMB --  CKMBINDEX --  TROPONINI <0.30   BNP (last 3 results) No results found for this basename: PROBNP:3 in the last 8760 hours CBG:  Lab 04/14/12 1205 04/14/12 0813 04/14/12 0410 04/14/12 0053 04/13/12 2330  GLUCAP 115* 102* 129* 74 107*    Recent Results (from the past 240 hour(s))  MRSA PCR SCREENING     Status: Normal   Collection Time   04/14/12  1:05 AM      Component Value Range Status Comment   MRSA by PCR NEGATIVE  NEGATIVE Final      Studies:  Recent x-ray studies have been reviewed in detail by the Attending Physician  Scheduled Meds:  Reviewed in detail by the Attending Physician  Scribed by Kandice Robinsons, RN ACNP Student [employer] Nursing for [employer] ANP.  I have interviewed and examined this patient along with the nurse practitioner student. I agree with the above assessment and plan. I have made any necessary editorial changes to the note.  Junious Silk, ANP [facility] Office  [phone] Pager [phone]  On-Call/Text Page:      [email or web address]      password [email or web address]  If 7PM-7AM, please contact night-coverage [email or web address] Password  [facility] 04/14/2012, 2:39 PM   LOS: 1 day    I examined the above patient and reviewed the chart- I agree with the above note which I have modified.   Calvert Cantor, MD [phone]

## 2012-04-14 NOTE — Consult Note (Signed)
Subjective:   HPI  The patient is a 41 year old male with a history of hereditary pancreatitis. He was diagnosed at [facility] in the 1970s. He has had multiple tests and evaluation of this over the years for this problem. In years past he had undergone multiple ERCPs with removal of stones from the pancreatic duct. He had been seen by Dr. Oren Binet as well as Dr. Noland Fordyce both of whom are world experts in pancreatic and biliary diseases as well as therapeutic biliary endoscopy. He has chronic calcific pancreatitis which has led to diabetes mellitus. In 2007 he was seen at [facility] by Dr. Fredric Mare and an endoscopic ultrasound was done which showed diffuse calcifications throughout his pancreas as well as a dilated pancreatic duct. He had some scattered lymph nodes in the area which were sampled by FNA with benign findings. The general EUS appearance was that of chronic calcific pancreatitis. He also had an ERCP at that time which showed a dilated pancreatic duct with a filling defect in the distal duct. He had had a pre-existing pancreatic sphincterotomy which was extended and then the pancreatic duct was swept with an extraction basket but they were unable to remove the pancreatic duct stone. A pancreatic stent was placed at that time. He has also been seen in the past at the pain clinic here in [facility] for chronic pain management from his chronic calcific pancreatitis. He then apparently moved to [place] and didn't have any further problems with significant episodes of pancreatitis until yesterday afternoon when he started to get epigastric pain and came in to the hospital emergency room in [facility] and was subsequently transferred and admitted to [facility]. His amylase and lipase were normal, but the CT showed some evidence of pancreatitis. His major complaint is that of upper abdominal pain.  The patient states that he had a colonoscopy 3 weeks ago in [facility] with Dr Noe Gens.  Review of Systems Denies chest pain, or shortness of breath  Past Medical History  Diagnosis Date  . Pancreatitis   . Diabetes mellitus without complication   . Back pain     degenerative disc   Past Surgical History  Procedure Date  . Ercp     Multiple   History   Social History  . Marital Status: Married    Spouse Name: N/A    Number of Children: N/A  . Years of Education: N/A   Occupational History  . Not on file.   Social History Main Topics  . Smoking status: Former Smoker    Types: Cigarettes    Quit date: 01/19/2011  . Smokeless tobacco: Not on file  . Alcohol Use: No  . Drug Use: No  . Sexually Active: Yes   Other Topics Concern  . Not on file   Social History Narrative  . No narrative on file   family history includes Pancreatitis in his father. Current facility-administered medications:0.9 %  sodium chloride infusion, , Intravenous, Continuous, Eduard Clos, MD, Last Rate: 250 mL/hr at 04/14/12 1123;  acetaminophen (TYLENOL) suppository 650 mg, 650 mg, Rectal, Q6H PRN, Eduard Clos, MD;  acetaminophen (TYLENOL) tablet 650 mg, 650 mg, Oral, Q6H PRN, Eduard Clos, MD;  diphenhydrAMINE (BENADRYL) 12.5 MG/5ML elixir 12.5 mg, 12.5 mg, Oral, Q6H PRN, Russella Dar, NP diphenhydrAMINE (BENADRYL) injection 12.5 mg, 12.5 mg, Intravenous, Q6H PRN, Russella Dar, NP;  [COMPLETED] HYDROmorphone (DILAUDID) injection 1 mg, 1 mg, Intravenous,  Once, [name], MD, 1 mg at 04/13/12 1858;  [COMPLETED] HYDROmorphone (DILAUDID) injection 1 mg, 1 mg, Intravenous, Once, [name], MD, 1 mg at 04/13/12 1924 [COMPLETED] HYDROmorphone (DILAUDID) injection 1 mg, 1 mg, Intravenous, Once, [name], MD, 1 mg at 04/13/12 2009;  HYDROmorphone (DILAUDID) injection 1 mg, 1 mg, Intramuscular, Q4H PRN, Russella Dar, NP, 1 mg at 04/14/12 1009;  HYDROmorphone (DILAUDID) PCA injection 0.3 mg/mL, , Intravenous, Q4H,  Russella Dar, NP [COMPLETED] imipenem-cilastatin (PRIMAXIN) 500 mg in sodium chloride 0.9 % 100 mL IVPB, 500 mg, Intravenous, Once, [name], MD, 500 mg at 04/13/12 2226;  insulin aspart (novoLOG) injection 0-9 Units, 0-9 Units, Subcutaneous, TID WC, Eduard Clos, MD;  [EXPIRED] iohexol (OMNIPAQUE) 300 MG/ML solution 25 mL, 25 mL, Oral, Q1 Hr x 2, Medication Radiologist, MD [COMPLETED] ketorolac (TORADOL) 30 MG/ML injection 30 mg, 30 mg, Intravenous, Once, [name], MD, 30 mg at 04/13/12 1924;  naloxone [facility]) injection 0.4 mg, 0.4 mg, Intravenous, PRN, Russella Dar, NP;  [COMPLETED] ondansetron [facility]) injection 4 mg, 4 mg, Intravenous, Once, [name], MD, 4 mg at 04/13/12 1858;  ondansetron (ZOFRAN) injection 4 mg, 4 mg, Intravenous, Q6H PRN, Eduard Clos, MD ondansetron [facility]) injection 4 mg, 4 mg, Intravenous, Q6H PRN, Russella Dar, NP;  ondansetron (ZOFRAN) tablet 4 mg, 4 mg, Oral, Q6H PRN, Eduard Clos, MD;  potassium chloride 10 mEq in 100 mL IVPB, 10 mEq, Intravenous, Q1 Hr x 4, Saima Rizwan, MD, 10 mEq at 04/14/12 1119;  [COMPLETED] sodium chloride 0.9 % bolus 1,000 mL, 1,000 mL, Intravenous, Once, [name], MD, 1,000 mL at 04/13/12 1858 [COMPLETED] sodium chloride 0.9 % bolus 1,000 mL, 1,000 mL, Intravenous, Once, [name], MD, Last Rate: 500 mL/hr at 04/13/12 2226, 1,000 mL at 04/13/12 2226;  sodium chloride 0.9 % injection 3 mL, 3 mL, Intravenous, Q12H, Eduard Clos, MD, 3 mL at 04/14/12 1002;  sodium chloride 0.9 % injection 9 mL, 9 mL, Intravenous, PRN, Russella Dar, NP;  [COMPLETED] white petrolatum (VASELINE) gel, , , ,  [DISCONTINUED] 0.9 %  sodium chloride infusion, , Intravenous, STAT, [name], MD;  [DISCONTINUED] HYDROmorphone (DILAUDID) injection 1 mg, 1 mg, Intravenous, Q4H PRN, [name], MD, 1 mg at 04/13/12 2229;  [DISCONTINUED] HYDROmorphone (DILAUDID) injection 1 mg, 1 mg, Intravenous, Q2H  PRN, Eduard Clos, MD, 1 mg at 04/14/12 6962 [DISCONTINUED] HYDROmorphone (DILAUDID) injection 2 mg, 2 mg, Intravenous, Q2H PRN, Calvert Cantor, MD, 2 mg at 04/14/12 0806;  [DISCONTINUED] ondansetron [facility]) injection 4 mg, 4 mg, Intravenous, Q8H PRN, [name], MD, 4 mg at 04/13/12 2229;  [DISCONTINUED] sodium chloride 0.9 % bolus 1,000 mL, 1,000 mL, Intravenous, Once, [name], MD [COMPLETED] sodium chloride 0.9 % bolus 1,000 mL, 1,000 mL, Intravenous, Once, [name], MD, 1,000 mL at 04/14/12 0215 Allergies  Allergen Reactions  . Ivp Dye (Iodinated Diagnostic Agents)     Hot and rapid heart beat  . Penicillins Hives     Objective:     BP 118/79  Pulse 101  Temp 98.4 F (36.9 C) (Oral)  Resp 18  Ht 5\' 11"  (1.803 m)  Wt 97.9 kg (215 lb 13.3 oz)  BMI 30.10 kg/m2  SpO2 95%  In general he appears uncomfortable.  Skin nonicteric  Neck supple  Heart regular rhythm, no murmurs  Lungs clear  Abdomen with normal bowel sounds, it is soft, there is tenderness in the epigastrium.  Extremities no edema  CT shows evidence  of pancreatitis, and a stone in the pancreatic duct  Laboratory No components found with this basename: d1      Assessment:     #1. Chronic hereditary calcific pancreatitis  #2. Acute on chronic pancreatitis  #3. Diabetes mellitus secondary to #1        Plan:     Supportive care with IV fluids, and analgesics for pain. Once he gets over this episode of pancreatitis he should have followup with Dr. Fredric Mare or if he chooses another therapeutic biliary endoscopist at one [facility] the [facility] closer to home since Dr. Fredric Mare has moved to [facility]. Lab Results  Component Value Date   HGB 12.4* 04/14/2012   HGB 16.0 04/13/2012   HCT 36.2* 04/14/2012   HCT 45.5 04/13/2012   ALKPHOS 70 04/14/2012   ALKPHOS 91 04/13/2012   AST 25 04/14/2012   AST 32 04/13/2012   ALT 24 04/14/2012   ALT 35 04/13/2012    AMYLASE 16 04/13/2012

## 2012-04-14 NOTE — Consult Note (Addendum)
PULMONARY  / CRITICAL CARE MEDICINE  Name: Curtis Spears MRN: [id number] DOB: Oct 11, 1970    LOS: 1  REFERRING MD:  [facility] (Rizwan)  CHIEF COMPLAINT:  Acute pancreatitis  BRIEF PATIENT DESCRIPTION: 41 yo with familial calcific pancreatitis admitted on 11/26 with acute abdominal pain.  Transferred to ICU due to acute encephalopathy and hypoxia.  LINES / TUBES: NA  CULTURES: NA  ANTIBIOTICS: 11/26  Meropenem x 1  SIGNIFICANT EVENTS:  11/26  Admitted on 11/26 with acute abdominal pain / pancreatitis.  Transferred to ICU due to acute encephalopathy and hypoxia.  LEVEL OF CARE:  ICU  PRIMARY SERVICE:  PCCM, back to [facility] when stable  CONSULTANTS:  GI (Ganem)  CODE STATUS:  Full  DIET:  NPO  DVT Px:  Heparin   GI Px:  Not indicated  The patient is encephalopathic and unable to provide history, which was obtained for available medical records.  HISTORY OF PRESENT ILLNESS:  41 yo with familial calcific pancreatitis admitted on 11/26 with acute abdominal pain.  Transferred to ICU due to acute encephalopathy and hypoxia.  PAST MEDICAL HISTORY :  Past Medical History  Diagnosis Date  . Pancreatitis   . Diabetes mellitus without complication   . Back pain     degenerative disc   Past Surgical History  Procedure Date  . Ercp     Multiple   Prior to Admission medications   Medication Sig Start Date End Date Taking? Authorizing Provider  Calcium Carb-Cholecalciferol (CALTRATE 600+D) 600-800 MG-UNIT TABS Take 1 tablet by mouth daily.   Yes Historical Provider, MD  DULoxetine (CYMBALTA) 30 MG capsule Take 30 mg by mouth daily.   Yes Historical Provider, MD  metFORMIN (GLUCOPHAGE) 1000 MG tablet Take 1,000 mg by mouth 2 (two) times daily with a meal.   Yes Historical Provider, MD  Multiple Vitamin (MULTIVITAMIN WITH MINERALS) TABS Take 1 tablet by mouth once a week. thursday   Yes Historical Provider, MD  Pancrelipase, Lip-Prot-Amyl, (CREON) 24000 UNITS CPEP Take 2 capsules by  mouth 4 (four) times daily.   Yes Historical Provider, MD   Allergies  Allergen Reactions  . Ivp Dye (Iodinated Diagnostic Agents)     Hot and rapid heart beat  . Penicillins Hives   FAMILY HISTORY:  Family History  Problem Relation Age of Onset  . Pancreatitis Father    SOCIAL HISTORY:  reports that he quit smoking about 14 months ago. His smoking use included Cigarettes. He does not have any smokeless tobacco history on file. He reports that he does not drink alcohol or use illicit drugs.  REVIEW OF SYSTEMS:  Unable to provide.  INTERVAL HISTORY:  VITAL SIGNS: Temp:  [97.6 F (36.4 C)-98.5 F (36.9 C)] 98.5 F (36.9 C) (11/26 1600) Pulse Rate:  [101-139] 112  (11/26 1800) Resp:  [12-24] 20  (11/26 1800) BP: (112-139)/(66-96) 133/66 mmHg (11/26 1600) SpO2:  [92 %-100 %] 92 % (11/26 1800) FiO2 (%):  [21 %] 21 % (11/26 0140) Weight:  [97.9 kg (215 lb 13.3 oz)] 97.9 kg (215 lb 13.3 oz) (11/26 0059) HEMODYNAMICS:   VENTILATOR SETTINGS: Vent Mode:  [-]  FiO2 (%):  [21 %] 21 % INTAKE / OUTPUT: Intake/Output      11/26 0701 - 11/27 0700   P.O. 120   I.V. (mL/kg) 3002 (30.7)   IV Piggyback 310   Total Intake(mL/kg) 3432 (35.1)   Urine (mL/kg/hr) 1400 (1.1)   Total Output 1400   Net +2032  PHYSICAL EXAMINATION: General:  Appears acutely ill, agitated Neuro:  Encephalopathic, nonfocal, cough / gag diminished HEENT:  PERRL, narrow posterior oropharynx Cardiovascular:  RRR, tachycardic, no m/r/g Lungs:  Bilateral diminished air entry, no w/r/r, periods of obstructive apnea noted Abdomen:  Soft, generalized tenderness, limited exam due to agitation Musculoskeletal:  Moves all extremities, no edema Skin:  Intact  LABS:  Lab 04/14/12 0145 04/14/12 0144 04/13/12 2227 04/13/12 2004 04/13/12 1848 04/13/12 1839  HGB -- 12.4* -- -- -- 16.0  WBC -- 7.5 -- -- -- 12.1*  PLT -- 123* -- -- -- 175  NA -- 142 -- -- -- 142  K -- 3.1* -- -- -- 3.6  CL -- 108 -- -- -- 104   CO2 -- 27 -- -- -- 25  GLUCOSE -- 103* -- -- -- 124*  BUN -- 15 -- -- -- 14  CREATININE -- 0.86 -- -- -- 1.00  CALCIUM -- 8.1* -- -- -- 9.3  MG -- -- -- -- -- --  PHOS -- -- -- -- -- --  AST -- 25 -- -- -- 32  ALT -- 24 -- -- -- 35  ALKPHOS -- 70 -- -- -- 91  BILITOT -- 0.5 -- -- -- 0.8  PROT -- 5.5* -- -- -- 7.3  ALBUMIN -- 3.3* -- -- -- 4.1  APTT -- -- -- -- -- --  INR -- -- -- -- -- --  LATICACIDVEN 0.6 -- 2.8* -- 6.0* --  TROPONINI -- -- -- <0.30 -- --  PROCALCITON -- -- -- -- -- --  PROBNP -- -- -- -- -- --  O2SATVEN -- -- -- -- -- --  PHART -- -- -- -- -- --  PCO2ART -- -- -- -- -- --  PO2ART -- -- -- -- -- --    Lab 04/14/12 1634 04/14/12 1205 04/14/12 0813 04/14/12 0410 04/14/12 0053  GLUCAP 88 115* 102* 129* 74   IMAGING: Ct Abdomen Pelvis Wo Contrast  04/13/2012  *RADIOLOGY REPORT*  Clinical Data: Sudden onset severe abdominal pain.  History of pancreatitis and diabetes.  Intravenous contrast allergy.  CT ABDOMEN AND PELVIS WITHOUT CONTRAST  Technique:  Multidetector CT imaging of the abdomen and pelvis was performed following the standard protocol without intravenous contrast.  Comparison: Radiographs obtained earlier today and CT dated 09/13/2005.  Findings: 6 mm calculus in the head of the pancreas.  The pancreatic duct proximal to the calculus is dilated, with a maximum diameter of the 8.5 mm on image number 24.  Previously demonstrated additional pancreatic calcifications are no longer visualized. Mild peripancreatic soft tissue stranding.  No fluid collections are seen.  Distended gallbladder with no visible gallstones and no gallbladder wall thickening or pericholecystic fluid.  Small upper pole left renal calculus.  No right renal, ureteral or bladder calculi.  No hydronephrosis.  Small bilateral inguinal hernias containing fat.  Diffuse, long segment wall thickening involving the proximal and mid sigmoid colon.  This appears to be due to lack of distention of that  portion of the colon.  Dilated stomach.  No small bowel or colon dilatation.  Mildly prominent mesenteric lymph nodes with no dominant pathologically enlarged lymph nodes.  Unremarkable noncontrasted appearance of the liver, spleen, adrenal glands and prostate gland.  Clear lung bases.  Mild lumbar and lower thoracic spine degenerative changes.  IMPRESSION:  1.  Mild changes of acute pancreatitis. 2.  6 mm calculus in the head of the pancreas, causing pancreatic ductal obstruction. 3.  Dilated  gallbladder.  This is most likely physiological due to the pancreatitis.  An obstructing, non-visualized and noncalcified stone is less likely. 4.  Gastric ileus due to the pancreatitis. 5.  Small bilateral inguinal hernias containing fat.   Original Report Authenticated By: Beckie Salts, M.D.    Dg Abd Acute W/chest  04/13/2012  *RADIOLOGY REPORT*  Clinical Data: Sudden onset severe abdominal pain.  ACUTE ABDOMEN SERIES (ABDOMEN 2 VIEW & CHEST 1 VIEW)  Comparison: None.  Findings: Frontal view of the chest shows midline trachea and normal heart size.  Calcified granuloma in the right lower lobe. Lungs are otherwise clear.  No pleural fluid.  Two views of the abdomen show gas and stool in the colon.  There may be a few associated air fluid levels.  No small bowel dilatation. A battery pack device projects over the lateral right abdomen.  IMPRESSION: Bowel gas pattern is nonspecific.  No evidence of overt obstruction.   Original Report Authenticated By: Leanna Battles, M.D.    ECG:  DIAGNOSES: Active Problems:  Diabetes mellitus  Pancreatic duct stones  Pancreatitis  Encephalopathy acute  Acute respiratory failure with hypoxia  ASSESSMENT / PLAN:  PULMONARY  A:  Acute hypoxemic respiratory failure. Suspected OSA, exacerbated by high doses of opioids.  Less likely ARDS related to acute pancreatitis. P:   Gaol SpO2>92 Continuous pulse oxymetry Supplemental oxygen via VTM as mouth breather Would avoid BiPAP  due to acute encephalopathy and risk of aspiration PCXR Pain control as below   CARDIOVASCULAR  A: Hemodynamically stable with normalized lactate after fluid resuscitation.  Physiologic tachycardia. P:  Cardiac monitoring  RENAL  A:  Normal renal function.  Hypokalemia. P:   IVF NS 250 mL/h Trend BMP Given 20 mEq this AM, will give additional 40  GASTROINTESTINAL  A:  Acute pancreatitis due to pancreatic duct stone. P:   NPO GI following Transfer to [facility] attempted for ERCP (Dr. Logan Bores [phone]) - no beds - advised to attempt again in 24-48 hours if no clinical improvement  HEMATOLOGIC  A:  No active issues. P:  Trend CBC  INFECTIOUS  A:  No evidence of acute infection. P:   Antibiotics are not indicated at this time as no evidence of pancreatic phlegmon / abscess.  ENDOCRINE   A:  DM.  Hyperglycemia.  P:   SSI  NEUROLOGIC  A:  Pain.  Acute encephalopathy. P:   Stop PCA for safety concerns Dilaudid 1 mg IV q2h and reevaluate  CLINICAL SUMMARY: 41 yo with familial calcific pancreatitis admitted on 11/26 with acute abdominal pain.  Transferred to ICU due to acute encephalopathy and hypoxia.  Will consider transfer for ERCP if no improvement overnight.  I have personally obtained a history, examined the patient, evaluated laboratory and imaging results, formulated the assessment and plan and placed orders.  CRITICAL CARE:  The patient is critically ill with multiple organ systems failure and requires high complexity decision making for assessment and support, frequent evaluation and titration of therapies, application of advanced monitoring technologies and extensive interpretation of multiple databases. Critical Care Time devoted to patient care services described in this note is 30 minutes.   Lonia Farber, MD  Pulmonary and Critical Care Medicine [facility] Pager: [phone]  04/14/2012, 8:18 PM

## 2012-04-14 NOTE — Progress Notes (Signed)
Spoke with Dr. Butler Denmark regarding safety concerns and pt oxygen saturation dropping to 80s with sleep.  He continues to writh and has poor safety awareness.  Sitter ordered and at bedside.  PCA basal rate of 2mg  per hour started and verified with second nurse.  Dr. Delford Field consult for CCM.

## 2012-04-14 NOTE — Progress Notes (Signed)
Pt alternately writhing and falling asleep.  Unable to complete sentence without falling asleep, but pain still at a 4, states he is at the best level he has been with his pain.

## 2012-04-14 NOTE — Progress Notes (Signed)
Pt continues to writh and roll  In the bed, refused futher pain medicine but after discussing his level and stating that it is 7/10 agreed to dilaudid IM per prn order.  Pt cont to take CO2 monitor off frequently, alert and oriented but states he does not know why he keeps taking it off.  Explained importance of monitoring CO2 levels.

## 2012-04-14 NOTE — H&P (Signed)
Curtis Spears is an 41 y.o. male.   Patient was seen and examined on April 14, 2012. PCP - [facility] in [place]. Gastroenterologist - patient was last seen by Dr. Izola Price in [place] this February. He also used to follow with Dr. Fredric Mare in [place] more than 2 years ago. Chief Complaint: Abdominal pain. 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 Had some nausea but denies any vomiting or diarrhea. The pain was mainly in the epigastric area radiating to both his flank area. The pain was typical of his pancreatitis. He came to the ER at [facility] and CAT scan showed features of pancreatitis with a pancreatic duct stone. At this time ER physician Dr. Manus Gunning had discussed with Dr. Leone Payor and at this time patient will be admitted for further management. Patient's initial lipase levels were high and lactic acid also was high. His blood pressure improved with fluids in the ER. On my exam patient was found to be mildly hypotensive with blood pressures in the 80s systolics. Patient still has mild abdominal pain. Denies any shots of breath or chest pain.  Past Medical History  Diagnosis Date  . Pancreatitis   . Diabetes mellitus without complication   . Back pain     degenerative disc    Past Surgical History  Procedure Date  . Ercp     Multiple    Family History  Problem Relation Age of Onset  . Pancreatitis Father    Social History:  reports that he quit smoking about 14 months ago. His smoking use included Cigarettes. He does not have any smokeless tobacco history on file. He reports that he does not drink alcohol or use illicit drugs.  Allergies:  Allergies  Allergen Reactions  . Ivp Dye (Iodinated Diagnostic Agents)     Hot and rapid heart beat  . Penicillins Hives    Medications Prior to  Admission  Medication Sig Dispense Refill  . DULoxetine HCl (CYMBALTA PO) Take by mouth.      . METFORMIN HCL PO Take by mouth.        Results for orders placed during the hospital encounter of 04/13/12 (from the past 48 hour(s))  CBC WITH DIFFERENTIAL     Status: Abnormal   Collection Time   04/13/12  6:39 PM      Component Value Range Comment   WBC 12.1 (*) 4.0 - 10.5 K/uL    RBC 5.27  4.22 - 5.81 MIL/uL    Hemoglobin 16.0  13.0 - 17.0 g/dL    HCT 16.1  09.6 - 04.5 %    MCV 86.3  78.0 - 100.0 fL    MCH 30.4  26.0 - 34.0 pg    MCHC 35.2  30.0 - 36.0 g/dL    RDW 40.9  81.1 - 91.4 %    Platelets 175  150 - 400 K/uL    Neutrophils Relative 74  43 - 77 %    Neutro Abs 9.0 (*) 1.7 - 7.7 K/uL    Lymphocytes Relative 14  12 - 46 %    Lymphs Abs 1.7  0.7 - 4.0 K/uL    Monocytes Relative 9  3 - 12 %    Monocytes Absolute 1.0  0.1 - 1.0 K/uL    Eosinophils Relative 3  0 - 5 %    Eosinophils Absolute 0.3  0.0 - 0.7 K/uL    Basophils Relative 0  0 - 1 %    Basophils Absolute 0.0  0.0 - 0.1 K/uL   COMPREHENSIVE METABOLIC PANEL     Status: Abnormal   Collection Time   04/13/12  6:39 PM      Component Value Range Comment   Sodium 142  135 - 145 mEq/L    Potassium 3.6  3.5 - 5.1 mEq/L    Chloride 104  96 - 112 mEq/L    CO2 25  19 - 32 mEq/L    Glucose, Bld 124 (*) 70 - 99 mg/dL    BUN 14  6 - 23 mg/dL    Creatinine, Ser 5.28  0.50 - 1.35 mg/dL    Calcium 9.3  8.4 - 41.3 mg/dL    Total Protein 7.3  6.0 - 8.3 g/dL    Albumin 4.1  3.5 - 5.2 g/dL    AST 32  0 - 37 U/L    ALT 35  0 - 53 U/L    Alkaline Phosphatase 91  39 - 117 U/L    Total Bilirubin 0.8  0.3 - 1.2 mg/dL    GFR calc non Af Amer >90  >90 mL/min    GFR calc Af Amer >90  >90 mL/min   LIPASE, BLOOD     Status: Abnormal   Collection Time   04/13/12  6:39 PM      Component Value Range Comment   Lipase 7 (*) 11 - 59 U/L   LACTIC ACID, PLASMA     Status: Abnormal   Collection Time   04/13/12  6:48 PM      Component Value  Range Comment   Lactic Acid, Venous 6.0 (*) 0.5 - 2.2 mmol/L   AMYLASE     Status: Normal   Collection Time   04/13/12  6:50 PM      Component Value Range Comment   Amylase 16  0 - 105 U/L   TROPONIN I     Status: Normal   Collection Time   04/13/12  8:04 PM      Component Value Range Comment   Troponin I <0.30  <0.30 ng/mL   URINALYSIS, ROUTINE W REFLEX MICROSCOPIC     Status: Abnormal   Collection Time   04/13/12  9:15 PM      Component Value Range Comment   Color, Urine AMBER (*) YELLOW BIOCHEMICALS MAY BE AFFECTED BY COLOR   APPearance CLEAR  CLEAR    Specific Gravity, Urine 1.029  1.005 - 1.030    pH 5.5  5.0 - 8.0    Glucose, UA NEGATIVE  NEGATIVE mg/dL    Hgb urine dipstick TRACE (*) NEGATIVE    Bilirubin Urine NEGATIVE  NEGATIVE    Ketones, ur 15 (*) NEGATIVE mg/dL    Protein, ur 30 (*) NEGATIVE mg/dL    Urobilinogen, UA 0.2  0.0 - 1.0 mg/dL    Nitrite NEGATIVE  NEGATIVE    Leukocytes, UA NEGATIVE  NEGATIVE   URINE MICROSCOPIC-ADD ON     Status: Abnormal   Collection Time   04/13/12  9:15 PM      Component Value Range Comment   Squamous Epithelial / LPF RARE  RARE    RBC / HPF 0-2  <3 RBC/hpf    Bacteria, UA RARE  RARE    Crystals CA OXALATE CRYSTALS (*) NEGATIVE    Urine-Other MUCOUS PRESENT     LACTIC ACID, PLASMA  Status: Abnormal   Collection Time   04/13/12 10:27 PM      Component Value Range Comment   Lactic Acid, Venous 2.8 (*) 0.5 - 2.2 mmol/L   GLUCOSE, CAPILLARY     Status: Abnormal   Collection Time   04/13/12 11:30 PM      Component Value Range Comment   Glucose-Capillary 107 (*) 70 - 99 mg/dL    Ct Abdomen Pelvis Wo Contrast  04/13/2012  *RADIOLOGY REPORT*  Clinical Data: Sudden onset severe abdominal pain.  History of pancreatitis and diabetes.  Intravenous contrast allergy.  CT ABDOMEN AND PELVIS WITHOUT CONTRAST  Technique:  Multidetector CT imaging of the abdomen and pelvis was performed following the standard protocol without intravenous  contrast.  Comparison: Radiographs obtained earlier today and CT dated 09/13/2005.  Findings: 6 mm calculus in the head of the pancreas.  The pancreatic duct proximal to the calculus is dilated, with a maximum diameter of the 8.5 mm on image number 24.  Previously demonstrated additional pancreatic calcifications are no longer visualized. Mild peripancreatic soft tissue stranding.  No fluid collections are seen.  Distended gallbladder with no visible gallstones and no gallbladder wall thickening or pericholecystic fluid.  Small upper pole left renal calculus.  No right renal, ureteral or bladder calculi.  No hydronephrosis.  Small bilateral inguinal hernias containing fat.  Diffuse, long segment wall thickening involving the proximal and mid sigmoid colon.  This appears to be due to lack of distention of that portion of the colon.  Dilated stomach.  No small bowel or colon dilatation.  Mildly prominent mesenteric lymph nodes with no dominant pathologically enlarged lymph nodes.  Unremarkable noncontrasted appearance of the liver, spleen, adrenal glands and prostate gland.  Clear lung bases.  Mild lumbar and lower thoracic spine degenerative changes.  IMPRESSION:  1.  Mild changes of acute pancreatitis. 2.  6 mm calculus in the head of the pancreas, causing pancreatic ductal obstruction. 3.  Dilated gallbladder.  This is most likely physiological due to the pancreatitis.  An obstructing, non-visualized and noncalcified stone is less likely. 4.  Gastric ileus due to the pancreatitis. 5.  Small bilateral inguinal hernias containing fat.   Original Report Authenticated By: Beckie Salts, M.D.    Dg Abd Acute W/chest  04/13/2012  *RADIOLOGY REPORT*  Clinical Data: Sudden onset severe abdominal pain.  ACUTE ABDOMEN SERIES (ABDOMEN 2 VIEW & CHEST 1 VIEW)  Comparison: None.  Findings: Frontal view of the chest shows midline trachea and normal heart size.  Calcified granuloma in the right lower lobe. Lungs are otherwise  clear.  No pleural fluid.  Two views of the abdomen show gas and stool in the colon.  There may be a few associated air fluid levels.  No small bowel dilatation. A battery pack device projects over the lateral right abdomen.  IMPRESSION: Bowel gas pattern is nonspecific.  No evidence of overt obstruction.   Original Report Authenticated By: Leanna Battles, M.D.     Review of Systems  Constitutional: Negative.   HENT: Negative.   Eyes: Negative.   Respiratory: Negative.   Cardiovascular: Negative.   Gastrointestinal: Positive for nausea and abdominal pain.  Genitourinary: Negative.   Musculoskeletal: Negative.   Skin: Negative.   Neurological: Negative.   Endo/Heme/Allergies: Negative.   Psychiatric/Behavioral: Negative.     Blood pressure 112/96, pulse 112, temperature 98 F (36.7 C), temperature source Oral, resp. rate 21, height 5\' 11"  (1.803 m), weight 97.9 kg (215 lb 13.3 oz), SpO2 100.00%.  Physical Exam  Constitutional: He is oriented to person, place, and time. He appears well-developed and well-nourished. No distress.  HENT:  Head: Normocephalic and atraumatic.  Right Ear: External ear normal.  Left Ear: External ear normal.  Nose: Nose normal.  Mouth/Throat: Oropharynx is clear and moist. No oropharyngeal exudate.  Eyes: Conjunctivae normal are normal. Pupils are equal, round, and reactive to light. Right eye exhibits no discharge. Left eye exhibits no discharge. No scleral icterus.  Neck: Normal range of motion. Neck supple.  Cardiovascular: Normal rate and regular rhythm.        Mild tachycardia.  Respiratory: Effort normal and breath sounds normal.  GI: Soft. Bowel sounds are normal. He exhibits no distension. There is tenderness. There is no rebound and no guarding.  Musculoskeletal: He exhibits no edema and no tenderness.  Neurological: He is alert and oriented to person, place, and time.       Moves all extremities.  Skin: Skin is warm and dry. He is not  diaphoretic.     Assessment/Plan #1. Acute pancreatitis with history of chronic familial calcific pancreatitis and pancreatic duct stone - at this time patient's blood pressure is in the lower side. I did discuss with pulmonary critical care. We will give her at least 3 more liters of normal saline bolus. Check lactic acid level stat. Patient will be kept n.p.o in anticipation of possible ERCP. #2. 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 Presently we will keep patient on every 4 hour CBG checks with insulin sliding scale.  CODE STATUS - full code.   Devin Ganaway N. 04/14/2012, 1:41 AM

## 2012-04-15 LAB — GLUCOSE, CAPILLARY
Glucose-Capillary: 83 mg/dL (ref 70–99)
Glucose-Capillary: 91 mg/dL (ref 70–99)
Glucose-Capillary: 100 mg/dL — ABNORMAL HIGH (ref 70–99)
Glucose-Capillary: 119 mg/dL — ABNORMAL HIGH (ref 70–99)
Glucose-Capillary: 155 mg/dL — ABNORMAL HIGH (ref 70–99)

## 2012-04-15 LAB — CBC
MCV: 91.6 fL (ref 78.0–100.0)
Platelets: 141 10*3/uL — ABNORMAL LOW (ref 150–400)
RBC: 4.31 MIL/uL (ref 4.22–5.81)
WBC: 13.4 10*3/uL — ABNORMAL HIGH (ref 4.0–10.5)

## 2012-04-15 LAB — BASIC METABOLIC PANEL
CO2: 21 mEq/L (ref 19–32)
Chloride: 105 mEq/L (ref 96–112)
Creatinine, Ser: 0.8 mg/dL (ref 0.50–1.35)
Sodium: 138 mEq/L (ref 135–145)

## 2012-04-15 MED ORDER — DULOXETINE HCL 30 MG PO CPEP
30.0000 mg | ORAL_CAPSULE | Freq: Every day | ORAL | Status: DC
  Administered 2012-04-15 – 2012-04-19 (×5): 30 mg via ORAL
  Filled 2012-04-15 (×5): qty 1

## 2012-04-15 MED ORDER — MAGNESIUM SULFATE 40 MG/ML IJ SOLN
2.0000 g | Freq: Once | INTRAMUSCULAR | Status: AC
  Administered 2012-04-15: 2 g via INTRAVENOUS
  Filled 2012-04-15 (×2): qty 50

## 2012-04-15 MED ORDER — BIOTENE DRY MOUTH MT LIQD
15.0000 mL | Freq: Two times a day (BID) | OROMUCOSAL | Status: DC
  Administered 2012-04-15 – 2012-04-19 (×5): 15 mL via OROMUCOSAL

## 2012-04-15 MED ORDER — DEXTROSE 5 % IV SOLN: 2.0000 g | Freq: Once | INTRAVENOUS | Status: DC

## 2012-04-15 MED FILL — Hydromorphone HCl Preservative Free (PF) Inj 2 MG/ML: INTRAMUSCULAR | Qty: 1 | Status: AC

## 2012-04-15 NOTE — Progress Notes (Signed)
Pt transferred to 2105 with all belongings. Pt A/O. VSS. Pain has decreased to 5/10 dull pain.

## 2012-04-15 NOTE — Progress Notes (Signed)
Per pt request nurse contacted MD to request Cymbalta while here in hospital, pt states that he takes it at home and feels that he needs it here as well.  MD acknowledged request and stated that he would review chart and provide orders.

## 2012-04-15 NOTE — Progress Notes (Addendum)
[facility] Progress Note  Subjective: Patient states his pain is moderately improved today compared to yesterday. He is lethargic and clots in and out of consciousness.  Objective: Vital signs in last 24 hours: Temp:  [98.2 F (36.8 C)-100.8 F (38.2 C)] 98.6 F (37 C) (11/27 0758) Pulse Rate:  [96-120] 96  (11/27 0700) Resp:  [9-24] 20  (11/27 0700) BP: (114-137)/(55-89) 134/80 mmHg (11/27 0700) SpO2:  [92 %-98 %] 98 % (11/27 0700) FiO2 (%):  [35 %-50 %] 35 % (11/27 0200) Weight:  [99.5 kg (219 lb 5.7 oz)] 99.5 kg (219 lb 5.7 oz) (11/27 0120) Weight change: 1.6 kg (3 lb 8.4 oz)   PE: Abdomen soft mild diffuse epigastric tenderness  Lab Results: Results for orders placed during the hospital encounter of 04/13/12 (from the past 24 hour(s))  GLUCOSE, CAPILLARY     Status: Abnormal   Collection Time   04/14/12 12:05 PM      Component Value Range   Glucose-Capillary 115 (*) 70 - 99 mg/dL  GLUCOSE, CAPILLARY     Status: Normal   Collection Time   04/14/12  4:34 PM      Component Value Range   Glucose-Capillary 88  70 - 99 mg/dL  GLUCOSE, CAPILLARY     Status: Normal   Collection Time   04/14/12  9:04 PM      Component Value Range   Glucose-Capillary 83  70 - 99 mg/dL  GLUCOSE, CAPILLARY     Status: Normal   Collection Time   04/15/12 12:16 AM      Component Value Range   Glucose-Capillary 83  70 - 99 mg/dL  GLUCOSE, CAPILLARY     Status: Abnormal   Collection Time   04/15/12  1:52 AM      Component Value Range   Glucose-Capillary 106 (*) 70 - 99 mg/dL   Comment 1 Documented in Chart     Comment 2 Notify RN    GLUCOSE, CAPILLARY     Status: Normal   Collection Time   04/15/12  4:20 AM      Component Value Range   Glucose-Capillary 91  70 - 99 mg/dL   Comment 1 Documented in Chart     Comment 2 Notify RN    MAGNESIUM     Status: Normal   Collection Time   04/15/12  4:35 AM      Component Value Range   Magnesium 1.7  1.5 - 2.5 mg/dL  CBC     Status:  Abnormal   Collection Time   04/15/12  4:35 AM      Component Value Range   WBC 13.4 (*) 4.0 - 10.5 K/uL   RBC 4.31  4.22 - 5.81 MIL/uL   Hemoglobin 13.1  13.0 - 17.0 g/dL   HCT 45.4  09.8 - 11.9 %   MCV 91.6  78.0 - 100.0 fL   MCH 30.4  26.0 - 34.0 pg   MCHC 33.2  30.0 - 36.0 g/dL   RDW 14.7  82.9 - 56.2 %   Platelets 141 (*) 150 - 400 K/uL  BASIC METABOLIC PANEL     Status: Abnormal   Collection Time   04/15/12  4:35 AM      Component Value Range   Sodium 138  135 - 145 mEq/L   Potassium 4.1  3.5 - 5.1 mEq/L   Chloride 105  96 - 112 mEq/L   CO2 21  19 - 32 mEq/L   Glucose, Bld  100 (*) 70 - 99 mg/dL   BUN 10  6 - 23 mg/dL   Creatinine, Ser 9.60  0.50 - 1.35 mg/dL   Calcium 8.1 (*) 8.4 - 10.5 mg/dL   GFR calc non Af Amer >90  >90 mL/min   GFR calc Af Amer >90  >90 mL/min    Studies/Results: Ct Abdomen Pelvis Wo Contrast  04/13/2012  *RADIOLOGY REPORT*  Clinical Data: Sudden onset severe abdominal pain.  History of pancreatitis and diabetes.  Intravenous contrast allergy.  CT ABDOMEN AND PELVIS WITHOUT CONTRAST  Technique:  Multidetector CT imaging of the abdomen and pelvis was performed following the standard protocol without intravenous contrast.  Comparison: Radiographs obtained earlier today and CT dated 09/13/2005.  Findings: 6 mm calculus in the head of the pancreas.  The pancreatic duct proximal to the calculus is dilated, with a maximum diameter of the 8.5 mm on image number 24.  Previously demonstrated additional pancreatic calcifications are no longer visualized. Mild peripancreatic soft tissue stranding.  No fluid collections are seen.  Distended gallbladder with no visible gallstones and no gallbladder wall thickening or pericholecystic fluid.  Small upper pole left renal calculus.  No right renal, ureteral or bladder calculi.  No hydronephrosis.  Small bilateral inguinal hernias containing fat.  Diffuse, long segment wall thickening involving the proximal and mid sigmoid  colon.  This appears to be due to lack of distention of that portion of the colon.  Dilated stomach.  No small bowel or colon dilatation.  Mildly prominent mesenteric lymph nodes with no dominant pathologically enlarged lymph nodes.  Unremarkable noncontrasted appearance of the liver, spleen, adrenal glands and prostate gland.  Clear lung bases.  Mild lumbar and lower thoracic spine degenerative changes.  IMPRESSION:  1.  Mild changes of acute pancreatitis. 2.  6 mm calculus in the head of the pancreas, causing pancreatic ductal obstruction. 3.  Dilated gallbladder.  This is most likely physiological due to the pancreatitis.  An obstructing, non-visualized and noncalcified stone is less likely. 4.  Gastric ileus due to the pancreatitis. 5.  Small bilateral inguinal hernias containing fat.   Original Report Authenticated By: Beckie Salts, M.D.    Dg Chest Port 1 View  04/14/2012  *RADIOLOGY REPORT*  Clinical Data: Hypoxia.  PORTABLE CHEST - 1 VIEW  Comparison: None.  Findings: Low lung volumes with bibasilar atelectasis and accentuation of the heart size and vascular markings.  No effusions.  No acute bony abnormality.  IMPRESSION: Low lung volumes, bibasilar atelectasis.   Original Report Authenticated By: Charlett Nose, M.D.    Dg Abd Acute W/chest  04/13/2012  *RADIOLOGY REPORT*  Clinical Data: Sudden onset severe abdominal pain.  ACUTE ABDOMEN SERIES (ABDOMEN 2 VIEW & CHEST 1 VIEW)  Comparison: None.  Findings: Frontal view of the chest shows midline trachea and normal heart size.  Calcified granuloma in the right lower lobe. Lungs are otherwise clear.  No pleural fluid.  Two views of the abdomen show gas and stool in the colon.  There may be a few associated air fluid levels.  No small bowel dilatation. A battery pack device projects over the lateral right abdomen.  IMPRESSION: Bowel gas pattern is nonspecific.  No evidence of overt obstruction.   Original Report Authenticated By: Leanna Battles, M.D.        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  Plan: I agree that any pancreatic endoscopic intervention would be [facility] appropriately  [facility] area. Will continue to provide supportive care until this can be arranged.    Emmy Keng C 04/15/2012, 9:24 AM

## 2012-04-15 NOTE — Consult Note (Signed)
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  Transferred to ICU due to acute encephalopathy and hypoxia.  LINES / TUBES: NA  CULTURES: NA  ANTIBIOTICS: 11/26  Meropenem x 1  SIGNIFICANT EVENTS:  11/26  Admitted on 11/26 with acute abdominal pain / pancreatitis.  Transferred to ICU due to acute encephalopathy and hypoxia.  LEVEL OF CARE:  ICU--can go to sdu 11/27  PRIMARY SERVICE:  PCCM, back to [facility] fo ram   CONSULTANTS:  GI (Ganem)  CODE STATUS:  Full  DIET:  NPO  DVT Px:  Heparin   GI Px:  Not indicated  INTERVAL HISTORY: Improved resp status  VITAL SIGNS: Temp:  [98.2 F (36.8 C)-100.8 F (38.2 C)] 98.6 F (37 C) (11/27 0758) Pulse Rate:  [96-120] 105  (11/27 0900) Resp:  [9-24] 12  (11/27 0900) BP: (114-137)/(55-89) 119/56 mmHg (11/27 0900) SpO2:  [92 %-98 %] 97 % (11/27 0900) FiO2 (%):  [35 %-50 %] 35 % (11/27 0200) Weight:  [99.5 kg (219 lb 5.7 oz)] 99.5 kg (219 lb 5.7 oz) (11/27 0120) HEMODYNAMICS:   VENTILATOR SETTINGS: Vent Mode:  [-]  FiO2 (%):  [35 %-50 %] 35 % INTAKE / OUTPUT: Intake/Output      11/26 0701 - 11/27 0700 11/27 0701 - 11/28 0700   P.O. 120    I.V. (mL/kg) 6002 (60.3) 750 (7.5)   IV Piggyback 710    Total Intake(mL/kg) 6832 (68.7) 750 (7.5)   Urine (mL/kg/hr) 2550 (1.1) 375 (0.8)   Total Output 2550 375   Net +4282 +375          PHYSICAL EXAMINATION: General:  Appears improved, alert Neuro:  Encephalopathic, nonfocal, more awake HEENT:  PERRL Cardiovascular:  RRR, tachycardic, no m/r/g Lungs:  Bilateral air entry improved Abdomen:  Soft, generalized tenderness, limited exam due to agitation Musculoskeletal:  Moves all extremities, no edema Skin:  Intact  LABS:  Lab 04/15/12  0435 04/14/12 0145 04/14/12 0144 04/13/12 2227 04/13/12 2004 04/13/12 1848 04/13/12 1839  HGB 13.1 -- 12.4* -- -- -- 16.0  WBC 13.4* -- 7.5 -- -- -- 12.1*  PLT 141* -- 123* -- -- -- 175  NA 138 -- 142 -- -- -- 142  K 4.1 -- 3.1* -- -- -- --  CL 105 -- 108 -- -- -- 104  CO2 21 -- 27 -- -- -- 25  GLUCOSE 100* -- 103* -- -- -- 124*  BUN 10 -- 15 -- -- -- 14  CREATININE 0.80 -- 0.86 -- -- -- 1.00  CALCIUM 8.1* -- 8.1* -- -- -- 9.3  MG 1.7 -- -- -- -- -- --  PHOS -- -- -- -- -- -- --  AST -- -- 25 -- -- -- 32  ALT -- -- 24 -- -- -- 35  ALKPHOS -- -- 70 -- -- -- 91  BILITOT -- -- 0.5 -- -- -- 0.8  PROT -- -- 5.5* -- -- -- 7.3  ALBUMIN -- -- 3.3* -- -- -- 4.1  APTT -- -- -- -- -- -- --  INR -- -- -- -- -- -- --  LATICACIDVEN -- 0.6 -- 2.8* -- 6.0* --  TROPONINI -- -- -- -- <0.30 -- --  PROCALCITON -- -- -- -- -- -- --  PROBNP -- -- -- -- -- -- --  O2SATVEN -- -- -- -- -- -- --  PHART -- -- -- -- -- -- --  PCO2ART -- -- -- -- -- -- --  PO2ART -- -- -- -- -- -- --    Lab 04/15/12 0420 04/15/12 0152 04/15/12 0016 04/14/12 2104 04/14/12 1634  GLUCAP 91 106* 83 83 88   IMAGING: Ct Abdomen Pelvis Wo Contrast  04/13/2012  *RADIOLOGY REPORT*  Clinical Data: Sudden onset severe abdominal pain.  History of pancreatitis and diabetes.  Intravenous contrast allergy.  CT ABDOMEN AND PELVIS WITHOUT CONTRAST  Technique:  Multidetector CT imaging of the abdomen and pelvis was performed following the standard protocol without intravenous contrast.  Comparison: Radiographs obtained earlier today and CT dated 09/13/2005.  Findings: 6 mm calculus in the head of the pancreas.  The pancreatic duct proximal to the calculus is dilated, with a maximum diameter of the 8.5 mm on image number 24.  Previously demonstrated additional pancreatic calcifications are no longer visualized. Mild peripancreatic soft tissue stranding.  No fluid collections are seen.  Distended gallbladder with no visible gallstones and no  gallbladder wall thickening or pericholecystic fluid.  Small upper pole left renal calculus.  No right renal, ureteral or bladder calculi.  No hydronephrosis.  Small bilateral inguinal hernias containing fat.  Diffuse, long segment wall thickening involving the proximal and mid sigmoid colon.  This appears to be due to lack of distention of that portion of the colon.  Dilated stomach.  No small bowel or colon dilatation.  Mildly prominent mesenteric lymph nodes with no dominant pathologically enlarged lymph nodes.  Unremarkable noncontrasted appearance of the liver, spleen, adrenal glands and prostate gland.  Clear lung bases.  Mild lumbar and lower thoracic spine degenerative changes.  IMPRESSION:  1.  Mild changes of acute pancreatitis. 2.  6 mm calculus in the head of the pancreas, causing pancreatic ductal obstruction. 3.  Dilated gallbladder.  This is most likely physiological due to the pancreatitis.  An obstructing, non-visualized and noncalcified stone is less likely. 4.  Gastric ileus due to the pancreatitis. 5.  Small bilateral inguinal hernias containing fat.   Original Report Authenticated By: Beckie Salts, M.D.    Dg Chest Port 1 View  04/14/2012  *RADIOLOGY REPORT*  Clinical Data: Hypoxia.  PORTABLE CHEST - 1 VIEW  Comparison: None.  Findings: Low lung volumes with bibasilar atelectasis and accentuation of the heart size and vascular markings.  No effusions.  No acute bony abnormality.  IMPRESSION: Low lung volumes, bibasilar atelectasis.   Original Report Authenticated By: Charlett Nose, M.D.    Dg Abd Acute W/chest  04/13/2012  *RADIOLOGY REPORT*  Clinical Data: Sudden onset severe abdominal pain.  ACUTE ABDOMEN SERIES (ABDOMEN 2 VIEW & CHEST 1 VIEW)  Comparison: None.  Findings: Frontal view of the chest shows midline trachea and normal heart size.  Calcified granuloma in the right lower lobe. Lungs are otherwise clear.  No pleural fluid.  Two views of the abdomen show gas and stool in the  colon.  There may be a few associated air fluid levels.  No small bowel dilatation. A battery pack device projects over the lateral right abdomen.  IMPRESSION: Bowel gas pattern is nonspecific.  No evidence of overt obstruction.   Original Report Authenticated By: Leanna Battles, M.D.    ECG:  DIAGNOSES: Active Problems:  Diabetes mellitus  Pancreatic duct stones  Pancreatitis  Encephalopathy acute  Acute respiratory failure with hypoxia  ASSESSMENT /  PLAN:  PULMONARY  A:  Acute hypoxemic respiratory failure. Suspected OSA, exacerbated by high doses of opioids.  Less likely ARDS related to acute pancreatitis. P:   Gaol SpO2>92 Continuous pulse oxymetry No apnea pcx rin am for ATX IS if able Hold off any NIMV with narcs  CARDIOVASCULAR  A: Hemodynamically stable with normalized lactate after fluid resuscitation.  Physiologic tachycardia. P:  Cardiac monitoring, remain BP follow after narcs  RENAL  A:  Normal renal function.  Hypokalemia resolved, hypoMag P:   IVF NS 250 mL/h , reduce to 125 Trend BMP in am  supp mag  GASTROINTESTINAL  A:  Acute pancreatitis due to pancreatic duct stone. P:   NPO, continue GI following Transfer to [facility] attempted for ERCP (Dr. Logan Bores [phone]) - no beds - advised to attempt again in 48 hours if no clinical improvement Primary should call in am , r e eval beds open No roel TPN as of now lft in am   HEMATOLOGIC  A:  No active issues. P:  At risk dvt, sub q hep  INFECTIOUS  A:  No evidence of acute infection. P:   Antibiotics are not indicated at this time as no evidence of pancreatic phlegmon / abscess. Low threshold re imaging if fever  ENDOCRINE   A:  DM.  Hyperglycemia. controlled P:   SSI  NEUROLOGIC  A:  Pain.  Acute encephalopathy. 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  Transferred to ICU due to acute encephalopathy and hypoxia.  Improved  I have personally obtained a history, examined the patient, evaluated laboratory and imaging results, formulated the assessment and plan and placed orders.   Nelda Bucks., MD  Pulmonary and Critical Care Medicine [facility] Pager: [phone]  04/15/2012, 11:28 AM

## 2012-04-15 NOTE — Progress Notes (Signed)
Noted new orders and order to transfer noted. Dr. Herma Carson paged to clarify orders to transfer. Continue forward with orders.

## 2012-04-15 NOTE — Progress Notes (Signed)
Dr. Herma Carson at bedside. Discussed pt condition, medications, oxygenation, course of care and report given from dayshift. Dr. Herma Carson stated that he may transfer pt to ICU. Will update pt and family when orders confirmed.

## 2012-04-15 NOTE — Progress Notes (Addendum)
~  2245: Informed that pt transferred needed to be confirmed with bed placement and that pt will be going to 2100. ~Informed pt was to go into bed 2105 and staffing was being adjusted on 2100. 0010: Attempt to call to give report, informed that nurse was not present and the Charge nurse will call this nurse back. 0020: Wife called and updated on pt's condition but MD's wishes to continue to ICU. Wife verbalized understanding and read back condition as well as new room number.  0040: Attempt to call report again, informed that nurse to receive pt was not present and that Charge nurse was busy with patient care.  ~0105: Pt to transferred to ICU with this nurse to follow.

## 2012-04-15 NOTE — Progress Notes (Deleted)
Documentation in error

## 2012-04-16 ENCOUNTER — Inpatient Hospital Stay (HOSPITAL_COMMUNITY): Payer: 59

## 2012-04-16 LAB — GLUCOSE, CAPILLARY
Glucose-Capillary: 152 mg/dL — ABNORMAL HIGH (ref 70–99)
Glucose-Capillary: 181 mg/dL — ABNORMAL HIGH (ref 70–99)
Glucose-Capillary: 109 mg/dL — ABNORMAL HIGH (ref 70–99)
Glucose-Capillary: 131 mg/dL — ABNORMAL HIGH (ref 70–99)

## 2012-04-16 LAB — COMPREHENSIVE METABOLIC PANEL
ALT: 34 U/L (ref 0–53)
AST: 76 U/L — ABNORMAL HIGH (ref 0–37)
Albumin: 3.2 g/dL — ABNORMAL LOW (ref 3.5–5.2)
Calcium: 8.1 mg/dL — ABNORMAL LOW (ref 8.4–10.5)
Creatinine, Ser: 0.72 mg/dL (ref 0.50–1.35)
Sodium: 140 mEq/L (ref 135–145)
Total Protein: 5.6 g/dL — ABNORMAL LOW (ref 6.0–8.3)

## 2012-04-16 MED ORDER — INSULIN ASPART 100 UNIT/ML ~~LOC~~ SOLN
0.0000 [IU] | Freq: Three times a day (TID) | SUBCUTANEOUS | Status: DC
  Administered 2012-04-16 – 2012-04-17 (×2): 2 [IU] via SUBCUTANEOUS
  Administered 2012-04-17: 5 [IU] via SUBCUTANEOUS
  Administered 2012-04-18: 3 [IU] via SUBCUTANEOUS
  Administered 2012-04-18 – 2012-04-19 (×2): 2 [IU] via SUBCUTANEOUS

## 2012-04-16 NOTE — Progress Notes (Signed)
[facility] Progress Note [place] TEAM 1 - Stepdown/ICU TEAM   PRAKASH KIMBERLING ZOX:[id number] DOB: 02/06/1971 DOA: 04/13/2012 PCP: No primary provider on file.  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 Had some nausea but denied any vomiting or diarrhea. The pain was mainly in the epigastric area radiating to both his flank areas. The pain was typical of his pancreatitis. He came to the ER at [facility] and CT scan showed features of pancreatitis with a pancreatic duct stone. Patient's initial lipase levels were high and lactic acid also was high. His blood pressure improved with fluids in the ER. After admission, the pt required transfer to ICU due to acute encephalopathy and hypoxia.  Assessment/Plan:  Pancreatic duct stone w/ acute on chronic familial calcific pancreatitis  Transfer to [facility] attempted for ERCP 11/26 (Dr. Logan Bores [phone]) - no beds - advised to attempt again in 48 hours if no clinical improvement - at this time it would appear that the patient is improving significantly - it's not clear to me that acute transfer via ambulance is a necessity at this time - we will continue to provide clinical support and monitor the patient closely for evidence of decline  DM Reasonably controlled at present - cont current tx plan  acute encephalopathy Due to narcotic meds + hypoxic resp failure   Acute hypoxic resp failure Suspected OSA exacerbated by opiods  Code Status: FULL Disposition Plan: Remain in step down unit  Consultants: [facility]  Procedures: none  Antibiotics: none  DVT prophylaxis: Sub Q heparin  HPI/Subjective: The patient is alert and conversant.  He does frequently closes eyes and appear to drift off to sleep but is easily awakened.  He reports this  pain is "significantly improved."  He is tolerating his clear liquid diet without any difficulty at this time.  He denies chest pain fevers chills or shortness of breath.   Objective: Blood pressure 124/76, pulse 115, temperature 97.4 F (36.3 C), temperature source Oral, resp. rate 17, height 5\' 11"  (1.803 m), weight 97.3 kg (214 lb 8.1 oz), SpO2 97.00%.  Intake/Output Summary (Last 24 hours) at 04/16/12 1331 Last data filed at 04/16/12 1200  Gross per 24 hour  Intake   2375 ml  Output   1750 ml  Net    625 ml     Exam: General: No acute respiratory distress Lungs: Clear to auscultation bilaterally without wheezes or crackles Cardiovascular: Regular rate and rhythm without murmur gallop or rub normal S1 and S2 Abdomen: Remarkably the patient is not tender to palpation in epigastrium or bilateral upper quadrants, bowel sounds present, there is no appreciable mass, there is no rebound or ascites Extremities: No significant cyanosis, clubbing, or edema bilateral lower extremities  Data Reviewed: Basic Metabolic Panel:  Lab 04/16/12 8295 04/15/12 0435 04/14/12 0144 04/13/12 1839  NA 140 138 142 142  K 4.0 4.1 3.1* 3.6  CL 105 105 108 104  CO2 29 21 27 25   GLUCOSE 120* 100* 103* 124*  BUN 6 10 15 14   CREATININE 0.72 0.80 0.86 1.00  CALCIUM 8.1* 8.1* 8.1* 9.3  MG -- 1.7 -- --  PHOS -- -- -- --   Liver Function Tests:  Lab 04/16/12 0440 04/14/12 0144 04/13/12 1839  AST 76* 25 32  ALT 34 24 35  ALKPHOS 56 70 91  BILITOT 1.2 0.5 0.8  PROT 5.6* 5.5* 7.3  ALBUMIN 3.2* 3.3* 4.1    Lab 04/13/12 1850 04/13/12 1839  LIPASE -- 7*  AMYLASE 16 --   CBC:  Lab 04/15/12 0435 04/14/12 0144 04/13/12 1839  WBC 13.4* 7.5 12.1*  NEUTROABS -- 5.0 9.0*  HGB 13.1 12.4* 16.0  HCT 39.5 36.2* 45.5  MCV 91.6 87.4 86.3  PLT 141* 123* 175   Cardiac Enzymes:  Lab 04/13/12 2004  CKTOTAL --  CKMB --  CKMBINDEX --  TROPONINI <0.30   CBG:  Lab 04/16/12 1159 04/16/12 0831 04/16/12  0331 04/15/12 1940 04/15/12 1632  GLUCAP 181* 152* 112* 155* 119*    Recent Results (from the past 240 hour(s))  MRSA PCR SCREENING     Status: Normal   Collection Time   04/14/12  1:05 AM      Component Value Range Status Comment   MRSA by PCR NEGATIVE  NEGATIVE Final      Studies:  Recent x-ray studies have been reviewed in detail by the Attending Physician  Scheduled Meds:  Reviewed in detail by the Attending Physician   Lonia Blood, MD [facility] Office  [phone] Pager [phone]  On-Call/Text Page:      [email or web address]      password [email or web address]  If 7PM-7AM, please contact night-coverage [email or web address] Password [email or web address] 04/16/2012, 1:31 PM   LOS: 3 days

## 2012-04-17 LAB — PHOSPHORUS: Phosphorus: 2.5 mg/dL (ref 2.3–4.6)

## 2012-04-17 LAB — GLUCOSE, CAPILLARY
Glucose-Capillary: 134 mg/dL — ABNORMAL HIGH (ref 70–99)
Glucose-Capillary: 99 mg/dL (ref 70–99)
Glucose-Capillary: 129 mg/dL — ABNORMAL HIGH (ref 70–99)

## 2012-04-17 LAB — COMPREHENSIVE METABOLIC PANEL
AST: 68 U/L — ABNORMAL HIGH (ref 0–37)
Albumin: 2.8 g/dL — ABNORMAL LOW (ref 3.5–5.2)
Calcium: 7.9 mg/dL — ABNORMAL LOW (ref 8.4–10.5)
Creatinine, Ser: 0.75 mg/dL (ref 0.50–1.35)
Sodium: 141 mEq/L (ref 135–145)
Total Protein: 5.1 g/dL — ABNORMAL LOW (ref 6.0–8.3)

## 2012-04-17 LAB — CBC
HCT: 36.3 % — ABNORMAL LOW (ref 39.0–52.0)
Hemoglobin: 12.4 g/dL — ABNORMAL LOW (ref 13.0–17.0)
MCH: 30.5 pg (ref 26.0–34.0)
MCV: 89.4 fL (ref 78.0–100.0)
RBC: 4.06 MIL/uL — ABNORMAL LOW (ref 4.22–5.81)

## 2012-04-17 MED ORDER — OXYCODONE HCL 5 MG PO TABS
5.0000 mg | ORAL_TABLET | ORAL | Status: DC | PRN
  Administered 2012-04-17: 5 mg via ORAL
  Administered 2012-04-17: 10 mg via ORAL
  Administered 2012-04-17: 5 mg via ORAL
  Administered 2012-04-17 – 2012-04-19 (×7): 10 mg via ORAL
  Filled 2012-04-17 (×6): qty 2
  Filled 2012-04-17: qty 1
  Filled 2012-04-17 (×3): qty 2
  Filled 2012-04-17: qty 1

## 2012-04-17 MED ORDER — HYDROMORPHONE HCL PF 1 MG/ML IJ SOLN
1.0000 mg | INTRAMUSCULAR | Status: DC | PRN
  Filled 2012-04-17: qty 1

## 2012-04-17 NOTE — Progress Notes (Signed)
Pt c/o 4/10 pain after dilaudid, requesting oxycodone.  Brought 5mg , but states he normally takes 10mg , second 5mg  tablet given.  Okey Regal RN notified. Pt getting ready to transfer now.

## 2012-04-17 NOTE — Progress Notes (Signed)
[facility] [place] TEAM 1 - Stepdown/ICU TEAM   I spoke with Dr. Madilyn Fireman with GI via telephone.  I explained to him that the patient was significantly improve symptomatically advised him that repeat bedside examinations from him were not necessary at this point.  We did discuss the known stone within the patient's pancreatic head.  Dr. Madilyn Fireman agreed that acute intervention was not indicated if the patient continued to clinically improve.  As a result my plan at this point is to very slowly progress his diet and to follow him for tolerance of this.  If he experiences no recurrence of severe pancreatitis symptoms he can be discharged to followup as an outpatient at the [facility] GI clinic.  Should he experience an acute severe recurrence of his symptoms GI can be re-consulted, though definitive treatment will consist of transferring the patient to [facility] for an advanced ERCP procedure.  Lonia Blood, MD [facility] Office  [phone] Pager [phone]  On-Call/Text Page:      [email or web address]      password [facility]

## 2012-04-17 NOTE — Progress Notes (Signed)
[facility] Progress Note [place] TEAM 1 - Stepdown/ICU TEAM   Curtis Spears ZOX:[id number] DOB: 12-02-1970 DOA: 04/13/2012 PCP: No primary provider on file.  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 Had some nausea but denied any vomiting or diarrhea. The pain was mainly in the epigastric area radiating to both his flank areas. The pain was typical of his pancreatitis. He came to the ER at [facility] and CT scan showed features of pancreatitis with a pancreatic duct stone. Patient's initial lipase levels were high and lactic acid also was high. His blood pressure improved with fluids in the ER. After admission, the pt required transfer to ICU due to acute encephalopathy and hypoxia.  Assessment/Plan:  Pancreatic duct stone w/ acute on chronic familial calcific pancreatitis  Transfer to [facility] attempted for ERCP 11/26 (Dr. Logan Bores [phone]) - no beds - advised to attempt again in 48 hours if no clinical improvement - at this time the pt has improved markedly - it's not clear to me that acute transfer via ambulance is a necessity at this time - we will continue to provide clinical support and monitor the patient closely for evidence of decline - I will discuss w/ the GI MD on call today - advance diet and follow as this may exacerbate his pancreatitis   DM Reasonably controlled at present - cont current tx plan  acute encephalopathy Due to narcotic meds + hypoxic resp failure - resolved   Acute hypoxic resp failure Suspected OSA exacerbated by opiods - stable at this time  Code Status: FULL Disposition Plan: stable for transfer to medical bed  Consultants: [facility]  Procedures: none  Antibiotics: none  DVT prophylaxis: Sub Q heparin  HPI/Subjective: The patient is much  more alert today.  He reports that he is hungry.  He admits that his pain is much improved at this time.  He denies f/c, sob, n/v, or cp.    Objective: Blood pressure 114/72, pulse 73, temperature 97.4 F (36.3 C), temperature source Oral, resp. rate 16, height 5\' 11"  (1.803 m), weight 98.3 kg (216 lb 11.4 oz), SpO2 98.00%.  Intake/Output Summary (Last 24 hours) at 04/17/12 1134 Last data filed at 04/17/12 1000  Gross per 24 hour  Intake   1125 ml  Output   1600 ml  Net   -475 ml     Exam: General: No acute respiratory distress Lungs: Clear to auscultation bilaterally without wheezes or crackles Cardiovascular: Regular rate and rhythm without murmur gallop or rub normal S1 and S2 Abdomen: NOT tender to palpation in epigastrium or bilateral upper quadrants, bowel sounds present, no appreciable mass, no rebound or ascites Extremities: No significant cyanosis, clubbing, or edema bilateral lower extremities  Data Reviewed: Basic Metabolic Panel:  Lab 04/17/12 8295 04/16/12 0440 04/15/12 0435 04/14/12 0144 04/13/12 1839  NA 141 140 138 142 142  K 3.6 4.0 4.1 3.1* 3.6  CL 106 105 105 108 104  CO2 32 29 21 27 25   GLUCOSE 115* 120* 100* 103* 124*  BUN 5* 6 10 15 14   CREATININE 0.75 0.72 0.80 0.86 1.00  CALCIUM 7.9* 8.1* 8.1* 8.1* 9.3  MG -- -- 1.7 -- --  PHOS 2.5 -- -- -- --   Liver Function Tests:  Lab 04/17/12 0434 04/16/12 0440 04/14/12 0144 04/13/12 1839  AST 68* 76*  25 32  ALT 43 34 24 35  ALKPHOS 49 56 70 91  BILITOT 0.8 1.2 0.5 0.8  PROT 5.1* 5.6* 5.5* 7.3  ALBUMIN 2.8* 3.2* 3.3* 4.1    Lab 04/16/12 1552 04/13/12 1850 04/13/12 1839  LIPASE 6* -- 7*  AMYLASE -- 16 --   CBC:  Lab 04/17/12 0434 04/15/12 0435 04/14/12 0144 04/13/12 1839  WBC 3.2* 13.4* 7.5 12.1*  NEUTROABS -- -- 5.0 9.0*  HGB 12.4* 13.1 12.4* 16.0  HCT 36.3* 39.5 36.2* 45.5  MCV 89.4 91.6 87.4 86.3  PLT 126* 141* 123* 175   Cardiac Enzymes:  Lab 04/13/12 2004  CKTOTAL --  CKMB --    CKMBINDEX --  TROPONINI <0.30   CBG:  Lab 04/17/12 1130 04/17/12 0729 04/16/12 2347 04/16/12 1934 04/16/12 1609  GLUCAP 99 134* 110* 88 131*    Recent Results (from the past 240 hour(s))  MRSA PCR SCREENING     Status: Normal   Collection Time   04/14/12  1:05 AM      Component Value Range Status Comment   MRSA by PCR NEGATIVE  NEGATIVE Final      Studies:  Recent x-ray studies have been reviewed in detail by the Attending Physician  Scheduled Meds:  Reviewed in detail by the Attending Physician   Lonia Blood, MD [facility] Office  [phone] Pager [phone]  On-Call/Text Page:      [email or web address]      password [email or web address]  If 7PM-7AM, please contact night-coverage [email or web address] Password [email or web address] 04/17/2012, 11:34 AM   LOS: 4 days

## 2012-04-17 NOTE — Progress Notes (Signed)
Patient transferred to 6N10.  Patient oriented to room and call light.  No complaints of pain or nausea.  Vital signs stable.  Will continue to monitor.

## 2012-04-17 NOTE — Progress Notes (Signed)
Order to transfer pt to med surg.  Bed available on 6N.  Pt notified of transfer to go via w/c.  All belongings packed.  He will notify family of transfer.  Report called to [facility] RN 6N.

## 2012-04-18 LAB — GLUCOSE, CAPILLARY
Glucose-Capillary: 121 mg/dL — ABNORMAL HIGH (ref 70–99)
Glucose-Capillary: 188 mg/dL — ABNORMAL HIGH (ref 70–99)

## 2012-04-18 LAB — COMPREHENSIVE METABOLIC PANEL
Albumin: 2.7 g/dL — ABNORMAL LOW (ref 3.5–5.2)
Alkaline Phosphatase: 54 U/L (ref 39–117)
BUN: 5 mg/dL — ABNORMAL LOW (ref 6–23)
Potassium: 3.4 mEq/L — ABNORMAL LOW (ref 3.5–5.1)
Sodium: 148 mEq/L — ABNORMAL HIGH (ref 135–145)
Total Protein: 5.1 g/dL — ABNORMAL LOW (ref 6.0–8.3)

## 2012-04-18 LAB — CBC
HCT: 36.2 % — ABNORMAL LOW (ref 39.0–52.0)
MCV: 88.7 fL (ref 78.0–100.0)
RDW: 12.5 % (ref 11.5–15.5)
WBC: 3 10*3/uL — ABNORMAL LOW (ref 4.0–10.5)

## 2012-04-18 NOTE — Progress Notes (Signed)
[facility] Progress Note   Curtis Spears ZOX:[id number] DOB: March 22, 1971 DOA: 04/13/2012 PCP: No primary provider on file.  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 Had some nausea but denied any vomiting or diarrhea. The pain was mainly in the epigastric area radiating to both his flank areas. The pain was typical of his pancreatitis. He came to the ER at [facility] and CT scan showed features of pancreatitis with a pancreatic duct stone. Patient's initial lipase levels were high and lactic acid also was high. His blood pressure improved with fluids in the ER. After admission, the pt required transfer to ICU due to acute encephalopathy and hypoxia.  Assessment/Plan:  Pancreatic duct stone w/ acute on chronic familial calcific pancreatitis  He continues to improve clinically. Has minimal pain and wants to try some solids today. Transfer to [facility] attempted for ERCP 11/26 (Dr. Logan Bores [phone]) - no beds - advised to attempt again in 48 hours if no clinical improvement - at this time the pt has improved markedly - it's not clear to me that acute transfer via ambulance is a necessity at this time - we will continue to provide clinical support and monitor the patient closely for evidence of decline. If his symptoms were to worsen, GI has recommended transfer to a tertiary center given his complicated pancreatic history.  DM Reasonably controlled at present - cont current tx plan  acute encephalopathy Due to narcotic meds + hypoxic resp failure - resolved   Acute hypoxic resp failure Suspected OSA exacerbated by opiods - Resolved.  Code Status: FULL Disposition Plan: likely DC home in am.  Consultants: [facility]  Procedures: none  Antibiotics: none  DVT prophylaxis: Sub Q  heparin  HPI/Subjective: The patient is much more alert today.  He reports that he is hungry.  He admits that his pain is much improved at this time.  He denies f/c, sob, n/v, or cp.    Objective: Blood pressure 127/56, pulse 75, temperature 97.3 F (36.3 C), temperature source Oral, resp. rate 18, height 5\' 11"  (1.803 m), weight 98.1 kg (216 lb 4.3 oz), SpO2 95.00%.  Intake/Output Summary (Last 24 hours) at 04/18/12 1104 Last data filed at 04/17/12 1850  Gross per 24 hour  Intake    490 ml  Output      0 ml  Net    490 ml     Exam: General: No acute respiratory distress Lungs: Clear to auscultation bilaterally without wheezes or crackles Cardiovascular: Regular rate and rhythm without murmur gallop or rub normal S1 and S2 Abdomen: NOT tender to palpation in epigastrium or bilateral upper quadrants, bowel sounds present, no appreciable mass, no rebound or ascites Extremities: No significant cyanosis, clubbing, or edema bilateral lower extremities  Data Reviewed: Basic Metabolic Panel:  Lab 04/18/12 8295 04/17/12 0434 04/16/12 0440 04/15/12 0435 04/14/12 0144  NA 148* 141 140 138 142  K 3.4* 3.6 4.0 4.1 3.1*  CL 107 106 105 105 108  CO2 35* 32 29 21 27   GLUCOSE 126* 115* 120* 100* 103*  BUN 5* 5* 6 10 15   CREATININE 0.77 0.75 0.72 0.80 0.86  CALCIUM 8.3* 7.9* 8.1* 8.1* 8.1*  MG -- -- -- 1.7 --  PHOS -- 2.5 -- -- --   Liver Function Tests:  Lab 04/18/12 0630 04/17/12 0434 04/16/12 0440 04/14/12  0144 04/13/12 1839  AST 53* 68* 76* 25 32  ALT 51 43 34 24 35  ALKPHOS 54 49 56 70 91  BILITOT 0.7 0.8 1.2 0.5 0.8  PROT 5.1* 5.1* 5.6* 5.5* 7.3  ALBUMIN 2.7* 2.8* 3.2* 3.3* 4.1    Lab 04/16/12 1552 04/13/12 1850 04/13/12 1839  LIPASE 6* -- 7*  AMYLASE -- 16 --   CBC:  Lab 04/18/12 0630 04/17/12 0434 04/15/12 0435 04/14/12 0144 04/13/12 1839  WBC 3.0* 3.2* 13.4* 7.5 12.1*  NEUTROABS -- -- -- 5.0 9.0*  HGB 12.2* 12.4* 13.1 12.4* 16.0  HCT 36.2* 36.3* 39.5 36.2* 45.5   MCV 88.7 89.4 91.6 87.4 86.3  PLT 143* 126* 141* 123* 175   Cardiac Enzymes:  Lab 04/13/12 2004  CKTOTAL --  CKMB --  CKMBINDEX --  TROPONINI <0.30   CBG:  Lab 04/18/12 0747 04/17/12 2236 04/17/12 1721 04/17/12 1130 04/17/12 0729  GLUCAP 121* 129* 215* 99 134*    Recent Results (from the past 240 hour(s))  MRSA PCR SCREENING     Status: Normal   Collection Time   04/14/12  1:05 AM      Component Value Range Status Comment   MRSA by PCR NEGATIVE  NEGATIVE Final      Studies:  Recent x-ray studies have been reviewed in detail by the Attending Physician  Scheduled Meds:  Reviewed in detail by the Attending Physician   Peggye Pitt, MD [facility] Pager: [phone]  On-Call/Text Page:      [email or web address]      password [email or web address]  If 7PM-7AM, please contact night-coverage [email or web address] Password [email or web address] 04/18/2012, 11:04 AM   LOS: 5 days

## 2012-04-19 LAB — COMPREHENSIVE METABOLIC PANEL
ALT: 69 U/L — ABNORMAL HIGH (ref 0–53)
Alkaline Phosphatase: 66 U/L (ref 39–117)
BUN: 8 mg/dL (ref 6–23)
CO2: 37 mEq/L — ABNORMAL HIGH (ref 19–32)
Chloride: 100 mEq/L (ref 96–112)
GFR calc Af Amer: >90 mL/min (ref >90)
GFR calc non Af Amer: >90 mL/min (ref >90)
Glucose, Bld: 182 mg/dL — ABNORMAL HIGH (ref 70–99)
Potassium: 3.3 mEq/L — ABNORMAL LOW (ref 3.5–5.1)
Total Bilirubin: 0.6 mg/dL (ref 0.3–1.2)
Total Protein: 5.7 g/dL — ABNORMAL LOW (ref 6.0–8.3)

## 2012-04-19 LAB — GLUCOSE, CAPILLARY: Glucose-Capillary: 161 mg/dL — ABNORMAL HIGH (ref 70–99)

## 2012-04-19 MED ORDER — POTASSIUM CHLORIDE CRYS ER 20 MEQ PO TBCR
40.0000 meq | EXTENDED_RELEASE_TABLET | Freq: Two times a day (BID) | ORAL | Status: DC
  Administered 2012-04-19: 40 meq via ORAL
  Filled 2012-04-19 (×2): qty 2

## 2012-04-19 MED ORDER — OXYCODONE HCL 5 MG PO TABS: 5.0000 mg | ORAL_TABLET | ORAL | Status: DC | PRN

## 2012-04-19 MED ORDER — ONDANSETRON HCL 4 MG PO TABS: 4.0000 mg | ORAL_TABLET | Freq: Four times a day (QID) | ORAL | Status: AC | PRN

## 2012-04-19 NOTE — Progress Notes (Signed)
[facility]                                                                                Patient Demographics  Curtis Spears, is a 41 y.o. male  ZOX:[id number]  WJX:[id number]  DOB - Jun 05, 1970  Admit date - 04/13/2012  Admitting Physician Lonia Blood, MD  Outpatient Primary MD for the patient is No primary provider on file.  LOS - 6   Chief Complaint  Patient presents with  . 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 Had some nausea but denied any vomiting or diarrhea. The pain was mainly in the epigastric area radiating to both his flank areas. The pain was typical of his pancreatitis. He came to the ER at [facility] and CT scan showed features of pancreatitis with a pancreatic duct stone. Patient's initial lipase levels were high and lactic acid also was high. His blood pressure improved with fluids in the ER. After admission, the pt required transfer to ICU due to acute encephalopathy and hypoxia.     Assessment/Plan:    Pancreatic duct stone w/ acute on chronic familial calcific pancreatitis  He continues to improve clinically. Has minimal pain and wants to try some solids today.   Transfer to [facility] attempted for ERCP 11/26 (Dr. Logan Spears [phone]) - no beds - advised to attempt again in 48 hours if no clinical improvement - at this time the pt has improved markedly - it's not clear to me that acute transfer via ambulance is a necessity at this time - we will continue to provide clinical support and monitor the patient closely for evidence of decline. If his symptoms were to worsen, have discussed his case with Dr. Madilyn Spears GI physician on the floor on 04/19/2012, since he is much clinically improved and  tolerating oral diet we'll schedule an outpatient with Dr. Benedetto Spears GI physician at tertiary facility along with Dr. Dulce Spears local GI physician   Follow-up Information    Follow up with Curtis Jaksch, MD. Schedule an appointment as soon as possible for a visit in 3 days.   Contact information:   [address] [place] [address] [phone]       Follow up with EVANS,JOHN, MD. Schedule an appointment as soon as possible for a visit in 2 days.   Contact information:   [address] [place] [address] [phone]       Follow up with Primary care physician. Schedule an appointment as soon as possible for a visit in 1 week.          DM  Reasonably controlled at present - cont current tx plan   CBG (last 3)   Basename 04/18/12 2241 04/18/12 1742 04/18/12 1217  GLUCAP 168* 188* 108*       acute encephalopathy  Due to narcotic meds + hypoxic resp failure - resolved    Acute hypoxic resp failure  Suspected OSA exacerbated by opiods - Resolved.    Code Status: FULL    Disposition Plan: likely DC home today. If his labs appears stable.   Consultants:  [facility]   Procedures:  none   Antibiotics:  none   DVT prophylaxis:  Sub Q heparin   Anti-infectives     Start     Dose/Rate Route Frequency Ordered Stop   04/13/12 2215   imipenem-cilastatin (PRIMAXIN) 500 mg in sodium chloride 0.9 % 100 mL IVPB        500 mg 200 mL/hr over 30 Minutes Intravenous  Once 04/13/12 2208 04/13/12 2331          Scheduled Meds:    . antiseptic oral rinse  15 mL Mouth Rinse BID  . DULoxetine  30 mg Oral Daily  . heparin subcutaneous  5,000 Units Subcutaneous Q8H  . insulin aspart  0-15 Units Subcutaneous TID WC   Continuous Infusions:    . sodium chloride Stopped (04/17/12 1254)   PRN Meds:.HYDROmorphone (DILAUDID) injection, ondansetron (ZOFRAN) IV, ondansetron, oxyCODONE   DVT Prophylaxis    Heparin    Lab Results  Component  Value Date   PLT 143* 04/18/2012      Curtis Spears on 04/19/2012 at 9:50 AM  Between 7am to 7pm - Pager - [phone]  After 7pm go to [email or web address] - password [email or web address]  And look for the night coverage person covering for me after hours  [facility] Office  [phone]    Subjective:   Curtis Spears today has, No headache, No chest pain, almost completely abdominal pain - No Nausea, No new weakness tingling or numbness, No Cough - SOB.   Objective:   Filed Vitals:   04/18/12 0500 04/18/12 1415 04/18/12 2243 04/19/12 0510  BP: 127/56 122/71 123/73 110/67  Pulse: 75 70 71 65  Temp: 97.3 F (36.3 C) 97.7 F (36.5 C) 97.8 F (36.6 C) 97.4 F (36.3 C)  TempSrc:  Oral Oral Oral  Resp: 18 16 17 17   Height:      Weight: 98.1 kg (216 lb 4.3 oz)   98.1 kg (216 lb 4.3 oz)  SpO2: 95% 94% 98% 94%    Wt Readings from Last 3 Encounters:  04/19/12 98.1 kg (216 lb 4.3 oz)     Intake/Output Summary (Last 24 hours) at 04/19/12 0950 Last data filed at 04/18/12 1416  Gross per 24 hour  Intake    480 ml  Output      0 ml  Net    480 ml    Exam Awake Alert, Oriented X 3, No new F.N deficits, Normal affect Canadian.AT,PERRAL Supple Neck,No JVD, No cervical lymphadenopathy appriciated.  Symmetrical Chest wall movement, Good air movement bilaterally, CTAB RRR,No Gallops,Rubs or new Murmurs, No Parasternal Heave +ve B.Sounds, Abd Soft, Non tender, No organomegaly appriciated, No rebound - guarding or rigidity. No Cyanosis, Clubbing or edema, No new Rash or bruise      Data Review   Micro Results Recent Results (from the past 240 hour(s))  MRSA PCR SCREENING     Status: Normal   Collection Time   04/14/12  1:05 AM      Component Value Range Status Comment   MRSA by PCR NEGATIVE  NEGATIVE Final     Radiology Reports Ct Abdomen Pelvis Wo Contrast  04/13/2012  *RADIOLOGY REPORT*  Clinical Data: Sudden onset severe abdominal pain.  History of pancreatitis and  diabetes.  Intravenous contrast allergy.  CT ABDOMEN AND  PELVIS WITHOUT CONTRAST  Technique:  Multidetector CT imaging of the abdomen and pelvis was performed following the standard protocol without intravenous contrast.  Comparison: Radiographs obtained earlier today and CT dated 09/13/2005.  Findings: 6 mm calculus in the head of the pancreas.  The pancreatic duct proximal to the calculus is dilated, with a maximum diameter of the 8.5 mm on image number 24.  Previously demonstrated additional pancreatic calcifications are no longer visualized. Mild peripancreatic soft tissue stranding.  No fluid collections are seen.  Distended gallbladder with no visible gallstones and no gallbladder wall thickening or pericholecystic fluid.  Small upper pole left renal calculus.  No right renal, ureteral or bladder calculi.  No hydronephrosis.  Small bilateral inguinal hernias containing fat.  Diffuse, long segment wall thickening involving the proximal and mid sigmoid colon.  This appears to be due to lack of distention of that portion of the colon.  Dilated stomach.  No small bowel or colon dilatation.  Mildly prominent mesenteric lymph nodes with no dominant pathologically enlarged lymph nodes.  Unremarkable noncontrasted appearance of the liver, spleen, adrenal glands and prostate gland.  Clear lung bases.  Mild lumbar and lower thoracic spine degenerative changes.  IMPRESSION:  1.  Mild changes of acute pancreatitis. 2.  6 mm calculus in the head of the pancreas, causing pancreatic ductal obstruction. 3.  Dilated gallbladder.  This is most likely physiological due to the pancreatitis.  An obstructing, non-visualized and noncalcified stone is less likely. 4.  Gastric ileus due to the pancreatitis. 5.  Small bilateral inguinal hernias containing fat.   Original Report Authenticated By: Beckie Salts, Spears.    Dg Chest Port 1 View  04/16/2012  *RADIOLOGY REPORT*  Clinical Data: Abdominal pain, shortness of breath, atelectasis   PORTABLE CHEST - 1 VIEW  Comparison: 04/14/2012; abdominal radiographic series - 04/13/2012  Findings:  Grossly unchanged enlarged cardiac silhouette and mediastinal contours.  Lung volumes remain reduced with persistent bilateral mid and lower lung linear heterogeneous opacities.  Mild pulmonary venous congestion without frank evidence of edema.  No definite pneumothorax or pleural effusion.  Unchanged bones.  IMPRESSION: 1.  Persistently reduced lung volumes with mid and lower lung atelectasis. Further evaluation with a PA and lateral chest radiograph may be obtained as clinically indicated. 2.  Pulmonary venous congestion without frank evidence of edema.   Original Report Authenticated By: Tacey Ruiz, MD    Dg Chest Port 1 View  04/14/2012  *RADIOLOGY REPORT*  Clinical Data: Hypoxia.  PORTABLE CHEST - 1 VIEW  Comparison: None.  Findings: Low lung volumes with bibasilar atelectasis and accentuation of the heart size and vascular markings.  No effusions.  No acute bony abnormality.  IMPRESSION: Low lung volumes, bibasilar atelectasis.   Original Report Authenticated By: Charlett Nose, Spears.    Dg Abd Acute W/chest  04/13/2012  *RADIOLOGY REPORT*  Clinical Data: Sudden onset severe abdominal pain.  ACUTE ABDOMEN SERIES (ABDOMEN 2 VIEW & CHEST 1 VIEW)  Comparison: None.  Findings: Frontal view of the chest shows midline trachea and normal heart size.  Calcified granuloma in the right lower lobe. Lungs are otherwise clear.  No pleural fluid.  Two views of the abdomen show gas and stool in the colon.  There may be a few associated air fluid levels.  No small bowel dilatation. A battery pack device projects over the lateral right abdomen.  IMPRESSION: Bowel gas pattern is nonspecific.  No evidence of overt obstruction.   Original Report Authenticated By: Juliette Alcide  Fredirick Lathe, Spears.     CBC  Lab 04/18/12 0630 04/17/12 0434 04/15/12 0435 04/14/12 0144 04/13/12 1839  WBC 3.0* 3.2* 13.4* 7.5 12.1*  HGB 12.2* 12.4*  13.1 12.4* 16.0  HCT 36.2* 36.3* 39.5 36.2* 45.5  PLT 143* 126* 141* 123* 175  MCV 88.7 89.4 91.6 87.4 86.3  MCH 29.9 30.5 30.4 30.0 30.4  MCHC 33.7 34.2 33.2 34.3 35.2  RDW 12.5 12.5 12.7 12.5 12.7  LYMPHSABS -- -- -- 1.5 1.7  MONOABS -- -- -- 0.8 1.0  EOSABS -- -- -- 0.2 0.3  BASOSABS -- -- -- 0.0 0.0  BANDABS -- -- -- -- --    Chemistries   Lab 04/18/12 0630 04/17/12 0434 04/16/12 0440 04/15/12 0435 04/14/12 0144 04/13/12 1839  NA 148* 141 140 138 142 --  K 3.4* 3.6 4.0 4.1 3.1* --  CL 107 106 105 105 108 --  CO2 35* 32 29 21 27  --  GLUCOSE 126* 115* 120* 100* 103* --  BUN 5* 5* 6 10 15  --  CREATININE 0.77 0.75 0.72 0.80 0.86 --  CALCIUM 8.3* 7.9* 8.1* 8.1* 8.1* --  MG -- -- -- 1.7 -- --  AST 53* 68* 76* -- 25 32  ALT 51 43 34 -- 24 35  ALKPHOS 54 49 56 -- 70 91  BILITOT 0.7 0.8 1.2 -- 0.5 0.8   ------------------------------------------------------------------------------------------------------------------ estimated creatinine clearance is 142.1 ml/min (by C-G formula based on Cr of 0.77). ------------------------------------------------------------------------------------------------------------------ No results found for this basename: HGBA1C:2 in the last 72 hours ------------------------------------------------------------------------------------------------------------------ No results found for this basename: CHOL:2,HDL:2,LDLCALC:2,TRIG:2,CHOLHDL:2,LDLDIRECT:2 in the last 72 hours ------------------------------------------------------------------------------------------------------------------ No results found for this basename: TSH,T4TOTAL,FREET3,T3FREE,THYROIDAB in the last 72 hours ------------------------------------------------------------------------------------------------------------------ No results found for this basename: VITAMINB12:2,FOLATE:2,FERRITIN:2,TIBC:2,IRON:2,RETICCTPCT:2 in the last 72 hours  Coagulation profile No results found for this  basename: INR:5,PROTIME:5 in the last 168 hours  No results found for this basename: DDIMER:2 in the last 72 hours  Cardiac Enzymes  Lab 04/13/12 2004  CKMB --  TROPONINI <0.30  MYOGLOBIN --   ------------------------------------------------------------------------------------------------------------------ No components found with this basename: POCBNP:3

## 2012-04-19 NOTE — Discharge Summary (Signed)
[facility]                                                                                   Curtis Spears, is a 41 y.o. male  DOB 11-26-1970  MRN [id number].  Admission date:  04/13/2012  Discharge Date:  04/19/2012  Primary MD  No primary provider on file.  Admitting Physician  Lonia Blood, MD  Admission Diagnosis  Pancreatitis [577.0] Pancreatic duct stones [577.8] abd pain  Discharge Diagnosis     Active Problems:  Diabetes mellitus  Pancreatic duct stones  Pancreatitis  Encephalopathy acute  Acute respiratory failure with hypoxia    Past Medical History  Diagnosis Date  . Pancreatitis   . Diabetes mellitus without complication   . Back pain     degenerative disc    Past Surgical History  Procedure Date  . Ercp     Multiple         Discharge Diagnoses:   Active Problems:  Diabetes mellitus  Pancreatic duct stones  Pancreatitis  Encephalopathy acute  Acute respiratory failure with hypoxia    Discharge Condition: Stable   Diet recommendation: See Discharge Instructions below   Consults Dr Sula Soda   History of present illness and  Hospital Course:  See H&P, Labs, Consult and Test reports for all details in brief, patient was admitted for abdominal pain due to acute on chronic pancreatitis with evidence of 6 mm calculus at the head of pancreas with a distended pancreatic duct, patient was seen by GI physician Dr. Madilyn Fireman, he was treated medically with bowel rest and pain medications with excellent benefit, and agree his symptoms have almost completely resolved, initially we thought that patient needs to be transferred via ambulance to a tertiary care facility as inpatient, however with his remarkable clinical improvement and tolerance of diet without much discomfort, today I discussed his case again with GI physician Dr. Madilyn Fireman who recommended that an outpatient GI followup suffice at this time, I will repeat patient's CMP if  it appears stable he will be discharged, I have requested him to follow with a local GI physician and a tertiary care GI physician both have been recommended on discharge instructions with contact information.   Patient has already been counseled not to overuse narcotics. He did have narcotic-induced encephalopathy and respiratory failure with hypoxia which has now completely resolved.    Low K was replaced today.   Today   Subjective:   Curtis Spears today has no headache,no chest abdominal pain,no new weakness tingling or numbness, feels much better wants to go home today.    Objective:   Blood pressure 110/67, pulse 65, temperature 97.4 F (36.3 C), temperature source Oral, resp. rate 17, height 5\' 11"  (1.803 m), weight 98.1 kg (216 lb 4.3 oz), SpO2 94.00%.   Intake/Output Summary (Last 24 hours) at 04/19/12 1125 Last data filed at 04/18/12 1416  Gross per 24 hour  Intake    480 ml  Output      0 ml  Net    480 ml    Exam Awake Alert, Oriented *3, No new F.N deficits, Normal affect Ponder.AT,PERRAL Supple Neck,No JVD, No  cervical lymphadenopathy appriciated.  Symmetrical Chest wall movement, Good air movement bilaterally, CTAB RRR,No Gallops,Rubs or new Murmurs, No Parasternal Heave +ve B.Sounds, Abd Soft, Non tender, No organomegaly appriciated, No rebound -guarding or rigidity. No Cyanosis, Clubbing or edema, No new Rash or bruise  Data Review   Major procedures and Radiology Reports - PLEASE review detailed and final reports for all details in brief -       Ct Abdomen Pelvis Wo Contrast  04/13/2012  *RADIOLOGY REPORT*  Clinical Data: Sudden onset severe abdominal pain.  History of pancreatitis and diabetes.  Intravenous contrast allergy.  CT ABDOMEN AND PELVIS WITHOUT CONTRAST  Technique:  Multidetector CT imaging of the abdomen and pelvis was performed following the standard protocol without intravenous contrast.  Comparison: Radiographs obtained earlier today and CT  dated 09/13/2005.  Findings: 6 mm calculus in the head of the pancreas.  The pancreatic duct proximal to the calculus is dilated, with a maximum diameter of the 8.5 mm on image number 24.  Previously demonstrated additional pancreatic calcifications are no longer visualized. Mild peripancreatic soft tissue stranding.  No fluid collections are seen.  Distended gallbladder with no visible gallstones and no gallbladder wall thickening or pericholecystic fluid.  Small upper pole left renal calculus.  No right renal, ureteral or bladder calculi.  No hydronephrosis.  Small bilateral inguinal hernias containing fat.  Diffuse, long segment wall thickening involving the proximal and mid sigmoid colon.  This appears to be due to lack of distention of that portion of the colon.  Dilated stomach.  No small bowel or colon dilatation.  Mildly prominent mesenteric lymph nodes with no dominant pathologically enlarged lymph nodes.  Unremarkable noncontrasted appearance of the liver, spleen, adrenal glands and prostate gland.  Clear lung bases.  Mild lumbar and lower thoracic spine degenerative changes.  IMPRESSION:  1.  Mild changes of acute pancreatitis. 2.  6 mm calculus in the head of the pancreas, causing pancreatic ductal obstruction. 3.  Dilated gallbladder.  This is most likely physiological due to the pancreatitis.  An obstructing, non-visualized and noncalcified stone is less likely. 4.  Gastric ileus due to the pancreatitis. 5.  Small bilateral inguinal hernias containing fat.   Original Report Authenticated By: Beckie Salts, M.D.    Dg Chest Port 1 View  04/16/2012  *RADIOLOGY REPORT*  Clinical Data: Abdominal pain, shortness of breath, atelectasis  PORTABLE CHEST - 1 VIEW  Comparison: 04/14/2012; abdominal radiographic series - 04/13/2012  Findings:  Grossly unchanged enlarged cardiac silhouette and mediastinal contours.  Lung volumes remain reduced with persistent bilateral mid and lower lung linear heterogeneous  opacities.  Mild pulmonary venous congestion without frank evidence of edema.  No definite pneumothorax or pleural effusion.  Unchanged bones.  IMPRESSION: 1.  Persistently reduced lung volumes with mid and lower lung atelectasis. Further evaluation with a PA and lateral chest radiograph may be obtained as clinically indicated. 2.  Pulmonary venous congestion without frank evidence of edema.   Original Report Authenticated By: Tacey Ruiz, MD    Dg Chest Port 1 View  04/14/2012  *RADIOLOGY REPORT*  Clinical Data: Hypoxia.  PORTABLE CHEST - 1 VIEW  Comparison: None.  Findings: Low lung volumes with bibasilar atelectasis and accentuation of the heart size and vascular markings.  No effusions.  No acute bony abnormality.  IMPRESSION: Low lung volumes, bibasilar atelectasis.   Original Report Authenticated By: Charlett Nose, M.D.    Dg Abd Acute W/chest  04/13/2012  *RADIOLOGY REPORT*  Clinical Data:  Sudden onset severe abdominal pain.  ACUTE ABDOMEN SERIES (ABDOMEN 2 VIEW & CHEST 1 VIEW)  Comparison: None.  Findings: Frontal view of the chest shows midline trachea and normal heart size.  Calcified granuloma in the right lower lobe. Lungs are otherwise clear.  No pleural fluid.  Two views of the abdomen show gas and stool in the colon.  There may be a few associated air fluid levels.  No small bowel dilatation. A battery pack device projects over the lateral right abdomen.  IMPRESSION: Bowel gas pattern is nonspecific.  No evidence of overt obstruction.   Original Report Authenticated By: Leanna Battles, M.D.     Micro Results      Recent Results (from the past 240 hour(s))  MRSA PCR SCREENING     Status: Normal   Collection Time   04/14/12  1:05 AM      Component Value Range Status Comment   MRSA by PCR NEGATIVE  NEGATIVE Final      CBC w Diff: Lab Results  Component Value Date   WBC 3.0* 04/18/2012   HGB 12.2* 04/18/2012   HCT 36.2* 04/18/2012   PLT 143* 04/18/2012   LYMPHOPCT 19  04/14/2012   MONOPCT 11 04/14/2012   EOSPCT 2 04/14/2012   BASOPCT 0 04/14/2012    CMP: Lab Results  Component Value Date   NA 142 04/19/2012   K 3.3* 04/19/2012   CL 100 04/19/2012   CO2 37* 04/19/2012   BUN 8 04/19/2012   CREATININE 0.85 04/19/2012   PROT 5.7* 04/19/2012   ALBUMIN 2.9* 04/19/2012   BILITOT 0.6 04/19/2012   ALKPHOS 66 04/19/2012   AST 58* 04/19/2012   ALT 69* 04/19/2012  .   Discharge Instructions     Follow with Primary MD in 7 days , followup with recommended GI physicians as instructed  Get CBC, CMP, checked 7 days by Primary MD and again as instructed by your Primary MD. Get a 2 view    Get Medicines reviewed and adjusted.  Please request your Prim.MD to go over all Hospital Tests and Procedure/Radiological results at the follow up, please get all Hospital records sent to your Prim MD by signing hospital release before you go home.  Activity: As tolerated with Full fall precautions use walker/cane & assistance as needed   Diet:  Heart healthy low-fat  For Heart failure patients - Check your Weight same time everyday, if you gain over 2 pounds, or you develop in leg swelling, experience more shortness of breath or chest pain, call your Primary MD immediately. Follow Cardiac Low Salt Diet and 1.8 lit/day fluid restriction.  Disposition Home   If you experience worsening of your admission symptoms, develop shortness of breath, life threatening emergency, suicidal or homicidal thoughts you must seek medical attention immediately by calling 911 or calling your MD immediately  if symptoms less severe.  You Must read complete instructions/literature along with all the possible adverse reactions/side effects for all the Medicines you take and that have been prescribed to you. Take any new Medicines after you have completely understood and accpet all the possible adverse reactions/side effects.   Do not drive and provide baby sitting services if your were admitted for  syncope or siezures until you have seen by Primary MD or a Neurologist and advised to do so again.  Do not drive when taking Pain medications.    Do not take more than prescribed Pain, Sleep and Anxiety Medications  Special Instructions: If you have  smoked or chewed Tobacco  in the last 2 yrs please stop smoking, stop any regular Alcohol  and or any Recreational drug use.  Wear Seat belts while driving.  Follow-up Information    Follow up with Freddy Jaksch, MD. Schedule an appointment as soon as possible for a visit in 3 days.   Contact information:   [address] [place] [address] [phone]       Follow up with EVANS,JOHN, MD. Schedule an appointment as soon as possible for a visit in 2 days.   Contact information:   [address] [place] [address] [phone]       Follow up with Primary care physician. Schedule an appointment as soon as possible for a visit in 1 week.           Discharge Medications     Medication List     As of 04/19/2012 11:25 AM    START taking these medications         ondansetron 4 MG tablet   Commonly known as: ZOFRAN   Take 1 tablet (4 mg total) by mouth every 6 (six) hours as needed for nausea.      oxyCODONE 5 MG immediate release tablet   Commonly known as: Oxy IR/ROXICODONE   Take 1-2 tablets (5-10 mg total) by mouth every 4 (four) hours as needed for pain.      CONTINUE taking these medications         CALTRATE 600+D 600-800 MG-UNIT Tabs   Generic drug: Calcium Carb-Cholecalciferol      CREON 24000 UNITS Cpep   Generic drug: Pancrelipase (Lip-Prot-Amyl)      DULoxetine 30 MG capsule   Commonly known as: CYMBALTA      metFORMIN 1000 MG tablet   Commonly known as: GLUCOPHAGE      multivitamin with minerals Tabs          Where to get your medications    These are the prescriptions that you need to pick up.   You may get these medications from any pharmacy.         ondansetron 4 MG  tablet   oxyCODONE 5 MG immediate release tablet               Total Time in preparing paper work, data evaluation and todays exam - 35 minutes  Leroy Sea M.D on 04/19/2012 at 11:25 AM  [facility] Office  [phone]

## 2012-06-05 ENCOUNTER — Emergency Department (HOSPITAL_BASED_OUTPATIENT_CLINIC_OR_DEPARTMENT_OTHER): Payer: 59

## 2012-06-05 ENCOUNTER — Emergency Department (HOSPITAL_BASED_OUTPATIENT_CLINIC_OR_DEPARTMENT_OTHER)
Admission: EM | Admit: 2012-06-05 | Discharge: 2012-06-05 | Disposition: A | Discharge status: Home / Self Care | Payer: 59 | Attending: Emergency Medicine | Admitting: Emergency Medicine

## 2012-06-05 ENCOUNTER — Encounter (HOSPITAL_BASED_OUTPATIENT_CLINIC_OR_DEPARTMENT_OTHER): Payer: Self-pay

## 2012-06-05 DIAGNOSIS — Z79899 Other long term (current) drug therapy: Secondary | ICD-10-CM | POA: Insufficient documentation

## 2012-06-05 DIAGNOSIS — E119 Type 2 diabetes mellitus without complications: Secondary | ICD-10-CM | POA: Insufficient documentation

## 2012-06-05 DIAGNOSIS — Z8739 Personal history of other diseases of the musculoskeletal system and connective tissue: Secondary | ICD-10-CM | POA: Insufficient documentation

## 2012-06-05 DIAGNOSIS — Z8719 Personal history of other diseases of the digestive system: Secondary | ICD-10-CM | POA: Insufficient documentation

## 2012-06-05 DIAGNOSIS — Z87891 Personal history of nicotine dependence: Secondary | ICD-10-CM | POA: Insufficient documentation

## 2012-06-05 DIAGNOSIS — N23 Unspecified renal colic: Secondary | ICD-10-CM

## 2012-06-05 DIAGNOSIS — R112 Nausea with vomiting, unspecified: Secondary | ICD-10-CM | POA: Insufficient documentation

## 2012-06-05 LAB — CBC WITH DIFFERENTIAL/PLATELET
Basophils Absolute: 0 10*3/uL (ref 0.0–0.1)
Basophils Relative: 0 % (ref 0–1)
Eosinophils Relative: 2 % (ref 0–5)
HCT: 44.1 % (ref 39.0–52.0)
Lymphocytes Relative: 17 % (ref 12–46)
MCHC: 35.1 g/dL (ref 30.0–36.0)
Monocytes Absolute: 0.7 10*3/uL (ref 0.1–1.0)
Neutro Abs: 7.1 10*3/uL (ref 1.7–7.7)
Platelets: 167 10*3/uL (ref 150–400)
RDW: 12.7 % (ref 11.5–15.5)
WBC: 9.4 10*3/uL (ref 4.0–10.5)

## 2012-06-05 LAB — LIPASE, BLOOD: Lipase: 4 U/L — ABNORMAL LOW (ref 11–59)

## 2012-06-05 LAB — COMPREHENSIVE METABOLIC PANEL
BUN: 15 mg/dL (ref 6–23)
CO2: 22 mEq/L (ref 19–32)
Calcium: 9.6 mg/dL (ref 8.4–10.5)
Chloride: 108 mEq/L (ref 96–112)
Creatinine, Ser: 1 mg/dL (ref 0.50–1.35)
GFR calc Af Amer: >90 mL/min (ref >90)
GFR calc non Af Amer: >90 mL/min (ref >90)
Glucose, Bld: 170 mg/dL — ABNORMAL HIGH (ref 70–99)
Total Bilirubin: 1.3 mg/dL — ABNORMAL HIGH (ref 0.3–1.2)

## 2012-06-05 LAB — URINE MICROSCOPIC-ADD ON

## 2012-06-05 LAB — URINALYSIS, ROUTINE W REFLEX MICROSCOPIC
Bilirubin Urine: NEGATIVE
Ketones, ur: NEGATIVE mg/dL
Leukocytes, UA: NEGATIVE
Nitrite: NEGATIVE
Specific Gravity, Urine: 1.021 (ref 1.005–1.030)
Urobilinogen, UA: 0.2 mg/dL (ref 0.0–1.0)
pH: 5 (ref 5.0–8.0)

## 2012-06-05 MED ORDER — TAMSULOSIN HCL 0.4 MG PO CAPS: 0.4000 mg | ORAL_CAPSULE | Freq: Every day | ORAL | Status: AC

## 2012-06-05 MED ORDER — SODIUM CHLORIDE 0.9 % IV BOLUS (SEPSIS): 1000.0000 mL | Freq: Once | INTRAVENOUS | Status: DC

## 2012-06-05 MED ORDER — ONDANSETRON HCL 4 MG PO TABS: 4.0000 mg | ORAL_TABLET | Freq: Four times a day (QID) | ORAL | Status: AC

## 2012-06-05 MED ORDER — HYDROMORPHONE HCL PF 1 MG/ML IJ SOLN
1.0000 mg | Freq: Once | INTRAMUSCULAR | Status: AC
  Administered 2012-06-05: 1 mg via INTRAVENOUS
  Filled 2012-06-05: qty 1

## 2012-06-05 MED ORDER — DICYCLOMINE HCL 10 MG/ML IM SOLN
20.0000 mg | Freq: Once | INTRAMUSCULAR | Status: AC
  Administered 2012-06-05: 20 mg via INTRAMUSCULAR
  Filled 2012-06-05: qty 2

## 2012-06-05 MED ORDER — PROMETHAZINE HCL 25 MG/ML IJ SOLN
25.0000 mg | Freq: Once | INTRAMUSCULAR | Status: AC
  Administered 2012-06-05: 25 mg via INTRAVENOUS
  Filled 2012-06-05: qty 1

## 2012-06-05 MED ORDER — IBUPROFEN 600 MG PO TABS: 600.0000 mg | ORAL_TABLET | Freq: Four times a day (QID) | ORAL | Status: AC | PRN

## 2012-06-05 NOTE — ED Provider Notes (Signed)
History     CSN: [id number]  Arrival date & time 06/05/12  0910   First MD Initiated Contact with Patient 06/05/12 [phone]      Chief Complaint  Patient presents with  . Abdominal Pain    (Consider location/radiation/quality/duration/timing/severity/associated sxs/prior treatment) HPI Pt presents with acute onset LUQ/epigastric pain during meeting at work consistent with previous pancreatic stone attacks. +nausea and vomiting. No fever or chills. 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  Past Medical History  Diagnosis Date  . Pancreatitis   . Diabetes mellitus without complication   . Back pain     degenerative disc    Past Surgical History  Procedure Date  . Ercp     Multiple    Family History  Problem Relation Age of Onset  . Pancreatitis Father     History  Substance Use Topics  . Smoking status: Former Smoker    Types: Cigarettes    Quit date: 01/19/2011  . Smokeless tobacco: Not on file  . Alcohol Use: No      Review of Systems  Constitutional: Negative for fever, chills and fatigue.  Respiratory: Negative for chest tightness and shortness of breath.   Cardiovascular: Negative for chest pain.  Gastrointestinal: Positive for nausea, vomiting and abdominal pain. Negative for diarrhea.  Genitourinary: Negative for dysuria and flank pain.  Musculoskeletal: Negative for back pain.  Skin: Negative for rash and wound.  Neurological: Negative for dizziness, weakness, light-headedness, numbness and headaches.    Allergies  Ivp dye and Penicillins  Home Medications   Current Outpatient Rx  Name  Route  Sig  Dispense  Refill  . CALCIUM CARB-CHOLECALCIFEROL 600-800 MG-UNIT PO TABS   Oral   Take 1 tablet by mouth daily.         . DULOXETINE HCL 30 MG PO CPEP   Oral   Take 30 mg by mouth daily.         . IBUPROFEN 600 MG PO TABS   Oral   Take 1 tablet (600 mg total) by mouth every 6 (six) hours as needed for pain.   30 tablet   0   . METFORMIN  HCL 1000 MG PO TABS   Oral   Take 1,000 mg by mouth 2 (two) times daily with a meal.         . ADULT MULTIVITAMIN W/MINERALS CH   Oral   Take 1 tablet by mouth once a week. thursday         . ONDANSETRON HCL 4 MG PO TABS   Oral   Take 1 tablet (4 mg total) by mouth every 6 (six) hours as needed for nausea.   20 tablet   0   . ONDANSETRON HCL 4 MG PO TABS   Oral   Take 1 tablet (4 mg total) by mouth every 6 (six) hours.   12 tablet   0   . OXYCODONE HCL 5 MG PO TABS   Oral   Take 1-2 tablets (5-10 mg total) by mouth every 4 (four) hours as needed for pain.   20 tablet   0   . PANCRELIPASE (LIP-PROT-AMYL) 24000 UNITS PO CPEP   Oral   Take 2 capsules by mouth 4 (four) times daily.         Marland Kitchen TAMSULOSIN HCL 0.4 MG PO CAPS   Oral   Take 1 capsule (0.4 mg total) by mouth daily.   7 capsule   0     BP 179/106  Pulse  104  Temp 98.2 F (36.8 C) (Oral)  Resp 20  Ht 5\' 11"  (1.803 m)  Wt 215 lb (97.523 kg)  BMI 29.99 kg/m2  SpO2 99%  Physical Exam  Nursing note and vitals reviewed. Constitutional: He is oriented to person, place, and time. He appears well-developed and well-nourished. He appears distressed.       Pt writhing in bed.   HENT:  Head: Normocephalic and atraumatic.  Mouth/Throat: Oropharynx is clear and moist.  Eyes: EOM are normal. Pupils are equal, round, and reactive to light.  Neck: Normal range of motion. Neck supple.  Cardiovascular: Normal rate and regular rhythm.   Pulmonary/Chest: Effort normal and breath sounds normal. No respiratory distress. He has no wheezes. He has no rales.  Abdominal: Soft. Bowel sounds are normal. He exhibits no distension and no mass. There is tenderness (mild-mod TTP in epigastrum and LUQ). There is no rebound and no guarding.  Musculoskeletal: Normal range of motion. He exhibits no edema and no tenderness.  Neurological: He is alert and oriented to person, place, and time.  Skin: Skin is warm and dry. No rash  noted. No erythema.  Psychiatric: He has a normal mood and affect. His behavior is normal.    ED Course  Procedures (including critical care time)  Labs Reviewed  COMPREHENSIVE METABOLIC PANEL - Abnormal; Notable for the following:    Glucose, Bld 170 (*)     Total Bilirubin 1.3 (*)     All other components within normal limits  LIPASE, BLOOD - Abnormal; Notable for the following:    Lipase 4 (*)     All other components within normal limits  URINALYSIS, ROUTINE W REFLEX MICROSCOPIC - Abnormal; Notable for the following:    APPearance CLOUDY (*)     Hgb urine dipstick LARGE (*)     All other components within normal limits  GLUCOSE, CAPILLARY - Abnormal; Notable for the following:    Glucose-Capillary 154 (*)     All other components within normal limits  URINE MICROSCOPIC-ADD ON - Abnormal; Notable for the following:    Bacteria, UA FEW (*)     Casts HYALINE CASTS (*)     Crystals CA OXALATE CRYSTALS (*)     All other components within normal limits  AMYLASE  CBC WITH DIFFERENTIAL   Ct Abdomen Pelvis Wo Contrast  06/05/2012  *RADIOLOGY REPORT*  Clinical Data: Hematuria, history pancreatitis.  CT ABDOMEN AND PELVIS WITHOUT CONTRAST  Technique:  Multidetector CT imaging of the abdomen and pelvis was performed following the standard protocol without intravenous contrast.  Comparison: CT 04/13/2012  Findings: Lung bases are clear.  No pericardial fluid.  No focal hepatic lesion.  The gallbladder is normal.  There are calculi within the distal common bile duct measuring 3 and 2 mm.  There is not changed from prior.  There is mild stranding surrounding the pancreatic head which could represent acute, subacute, or chronic pancreatitis.  Pancreas is atrophic distally.  The spleen is normal.  Adrenal glands are normal.  There is mild pelvicaliectasis and hydroureter on the left.  This secondary to an 3 mm obstructing calculus within the distal left ureter approximately 10 mm from the  vesicoureteral junction.  The stomach, small bowel, appendix, and colon are normal.  There are no venous collaterals along the greater curvature of the stomach cyst with portal hypertension.  The  In the pelvis, and bladder stones.  Distal left ureteral stone described above.  No pelvic lymphadenopathy. Review of  bone windows demonstrates no aggressive osseous lesions.  IMPRESSION:  1.  Obstructing calculus in the distal left ureter approximately 1 cm in the vesicoureteral junction. 2.  Chronic choledocholithiasis with pancreatic inflammation.  The findings are similar to prior. 3.  Venous collaterals in the upper abdomen suggests portal venous hypertension or splenic vein thrombosis.   Original Report Authenticated By: Genevive Bi, M.D.    US Abdomen Complete  06/05/2012  *RADIOLOGY REPORT*  Clinical Data:  Abdominal pain and vomiting  ABDOMINAL ULTRASOUND COMPLETE  Comparison:  04/13/2012  Findings:  Gallbladder:  No gallstones, gallbladder wall thickening, or pericholecystic fluid.  Common Bile Duct:  Not visualized  Liver: Diminished exam detail due to bowel gas.  No focal mass lesion identified.  Within normal limits in parenchymal echogenicity.  IVC:  Appears normal.  Pancreas:  Diminished exam detail due to overlying bowel gas.  No abnormality identified.  Spleen:  Diminished exam detail due to overlying bowel gas.  Within normal limits in size and echotexture.  Right kidney:  Measures 11.7 cm. Normal in size and parenchymal echogenicity.  No evidence of mass or hydronephrosis.  Left kidney:  Measures 12.7 cm.Normal in size and parenchymal echogenicity.  No evidence of mass or hydronephrosis.  Abdominal Aorta:  No aneurysm identified.  IMPRESSION:  1.  Overall diminished exam detail due to overlying bowel gas. 2.  No acute findings visualize.   Original Report Authenticated By: Signa Kell, M.D.      1. Renal colic on left side       MDM   Pt is now comfortable. Think symptoms, CT and UA  all consistent with renal colic. F/u with urology as needed. F/u with GI for chronic pancreatic issues.        Loren Racer, MD 06/05/12 1455

## 2012-06-05 NOTE — ED Notes (Signed)
Pt reports abdominal pain that started 2 days ago worsening this am. Pt has a hx of pancreatitis.

## 2012-06-05 NOTE — ED Notes (Signed)
CBG 154.  RN notified.

## 2013-05-31 ENCOUNTER — Encounter (HOSPITAL_BASED_OUTPATIENT_CLINIC_OR_DEPARTMENT_OTHER): Payer: Self-pay | Admitting: Emergency Medicine

## 2013-05-31 ENCOUNTER — Emergency Department (HOSPITAL_BASED_OUTPATIENT_CLINIC_OR_DEPARTMENT_OTHER)
Admission: EM | Admit: 2013-05-31 | Discharge: 2013-05-31 | Disposition: A | Discharge status: Home / Self Care | Payer: 59 | Attending: Emergency Medicine | Admitting: Emergency Medicine

## 2013-05-31 DIAGNOSIS — T22259A Burn of second degree of unspecified shoulder, initial encounter: Secondary | ICD-10-CM | POA: Insufficient documentation

## 2013-05-31 DIAGNOSIS — Y92009 Unspecified place in unspecified non-institutional (private) residence as the place of occurrence of the external cause: Secondary | ICD-10-CM | POA: Insufficient documentation

## 2013-05-31 DIAGNOSIS — G8929 Other chronic pain: Secondary | ICD-10-CM | POA: Insufficient documentation

## 2013-05-31 DIAGNOSIS — K859 Acute pancreatitis without necrosis or infection, unspecified: Secondary | ICD-10-CM | POA: Insufficient documentation

## 2013-05-31 DIAGNOSIS — Z794 Long term (current) use of insulin: Secondary | ICD-10-CM | POA: Insufficient documentation

## 2013-05-31 DIAGNOSIS — Z8739 Personal history of other diseases of the musculoskeletal system and connective tissue: Secondary | ICD-10-CM | POA: Insufficient documentation

## 2013-05-31 DIAGNOSIS — T2122XA Burn of second degree of abdominal wall, initial encounter: Secondary | ICD-10-CM

## 2013-05-31 DIAGNOSIS — Z792 Long term (current) use of antibiotics: Secondary | ICD-10-CM | POA: Insufficient documentation

## 2013-05-31 DIAGNOSIS — Z880 Allergy status to penicillin: Secondary | ICD-10-CM | POA: Insufficient documentation

## 2013-05-31 DIAGNOSIS — Z87891 Personal history of nicotine dependence: Secondary | ICD-10-CM | POA: Insufficient documentation

## 2013-05-31 DIAGNOSIS — Y9389 Activity, other specified: Secondary | ICD-10-CM | POA: Insufficient documentation

## 2013-05-31 DIAGNOSIS — Z79899 Other long term (current) drug therapy: Secondary | ICD-10-CM | POA: Insufficient documentation

## 2013-05-31 DIAGNOSIS — E119 Type 2 diabetes mellitus without complications: Secondary | ICD-10-CM | POA: Insufficient documentation

## 2013-05-31 DIAGNOSIS — X19XXXA Contact with other heat and hot substances, initial encounter: Secondary | ICD-10-CM | POA: Insufficient documentation

## 2013-05-31 MED ORDER — OXYCODONE-ACETAMINOPHEN 5-325 MG PO TABS: 1.0000 | ORAL_TABLET | ORAL | Status: DC | PRN

## 2013-05-31 MED ORDER — SILVER SULFADIAZINE 1 % EX CREA: TOPICAL_CREAM | Freq: Two times a day (BID) | CUTANEOUS | Status: AC

## 2013-05-31 MED ORDER — SILVER SULFADIAZINE 1 % EX CREA
TOPICAL_CREAM | Freq: Once | CUTANEOUS | Status: DC
  Filled 2013-05-31: qty 85
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
## 2013-05-31 NOTE — ED Notes (Signed)
silverdine cream not available at this time . Bacitracin placed with drg and informed pt to change drg at home to silverdine

## 2013-05-31 NOTE — Discharge Instructions (Signed)

## 2013-06-01 NOTE — ED Provider Notes (Signed)
Medical screening examination/treatment/procedure(s) were performed by non-physician practitioner and as supervising physician I was immediately available for consultation/collaboration.  EKG Interpretation   None         Zamar Odwyer T Patirica Longshore, MD 06/01/13 0055 

## 2013-09-27 ENCOUNTER — Emergency Department (HOSPITAL_BASED_OUTPATIENT_CLINIC_OR_DEPARTMENT_OTHER)
Admission: EM | Admit: 2013-09-27 | Discharge: 2013-09-28 | Disposition: A | Discharge status: Home / Self Care | Payer: 59 | Attending: Emergency Medicine | Admitting: Emergency Medicine

## 2013-09-27 ENCOUNTER — Emergency Department (HOSPITAL_BASED_OUTPATIENT_CLINIC_OR_DEPARTMENT_OTHER): Payer: 59

## 2013-09-27 ENCOUNTER — Encounter (HOSPITAL_BASED_OUTPATIENT_CLINIC_OR_DEPARTMENT_OTHER): Payer: Self-pay | Admitting: Emergency Medicine

## 2013-09-27 DIAGNOSIS — R197 Diarrhea, unspecified: Secondary | ICD-10-CM | POA: Insufficient documentation

## 2013-09-27 DIAGNOSIS — Z79899 Other long term (current) drug therapy: Secondary | ICD-10-CM | POA: Insufficient documentation

## 2013-09-27 DIAGNOSIS — R1012 Left upper quadrant pain: Secondary | ICD-10-CM | POA: Insufficient documentation

## 2013-09-27 DIAGNOSIS — Z880 Allergy status to penicillin: Secondary | ICD-10-CM | POA: Insufficient documentation

## 2013-09-27 DIAGNOSIS — Z794 Long term (current) use of insulin: Secondary | ICD-10-CM | POA: Insufficient documentation

## 2013-09-27 DIAGNOSIS — E119 Type 2 diabetes mellitus without complications: Secondary | ICD-10-CM | POA: Insufficient documentation

## 2013-09-27 DIAGNOSIS — K861 Other chronic pancreatitis: Secondary | ICD-10-CM | POA: Insufficient documentation

## 2013-09-27 DIAGNOSIS — Z9089 Acquired absence of other organs: Secondary | ICD-10-CM | POA: Insufficient documentation

## 2013-09-27 DIAGNOSIS — Z8739 Personal history of other diseases of the musculoskeletal system and connective tissue: Secondary | ICD-10-CM | POA: Insufficient documentation

## 2013-09-27 DIAGNOSIS — Z87442 Personal history of urinary calculi: Secondary | ICD-10-CM | POA: Insufficient documentation

## 2013-09-27 DIAGNOSIS — Z792 Long term (current) use of antibiotics: Secondary | ICD-10-CM | POA: Insufficient documentation

## 2013-09-27 DIAGNOSIS — R109 Unspecified abdominal pain: Secondary | ICD-10-CM

## 2013-09-27 DIAGNOSIS — R110 Nausea: Secondary | ICD-10-CM | POA: Insufficient documentation

## 2013-09-27 DIAGNOSIS — Z9889 Other specified postprocedural states: Secondary | ICD-10-CM | POA: Insufficient documentation

## 2013-09-27 DIAGNOSIS — Z87891 Personal history of nicotine dependence: Secondary | ICD-10-CM | POA: Insufficient documentation

## 2013-09-27 DIAGNOSIS — Z791 Long term (current) use of non-steroidal anti-inflammatories (NSAID): Secondary | ICD-10-CM | POA: Insufficient documentation

## 2013-09-27 HISTORY — DX: Calculus of kidney: N20.0

## 2013-09-27 LAB — CBC WITH DIFFERENTIAL/PLATELET
BASOS ABS: 0 10*3/uL (ref 0.0–0.1)
Basophils Relative: 0 % (ref 0–1)
EOS ABS: 0.3 10*3/uL (ref 0.0–0.7)
EOS PCT: 3 % (ref 0–5)
HCT: 46.7 % (ref 39.0–52.0)
Hemoglobin: 16.1 g/dL (ref 13.0–17.0)
LYMPHS PCT: 28 % (ref 12–46)
Lymphs Abs: 2.4 10*3/uL (ref 0.7–4.0)
MCH: 31 pg (ref 26.0–34.0)
MCHC: 34.5 g/dL (ref 30.0–36.0)
MCV: 90 fL (ref 78.0–100.0)
Monocytes Absolute: 0.8 10*3/uL (ref 0.1–1.0)
Monocytes Relative: 9 % (ref 3–12)
Neutro Abs: 5.3 10*3/uL (ref 1.7–7.7)
Neutrophils Relative %: 61 % (ref 43–77)
PLATELETS: 196 10*3/uL (ref 150–400)
RBC: 5.19 MIL/uL (ref 4.22–5.81)
RDW: 12.5 % (ref 11.5–15.5)
WBC: 8.7 10*3/uL (ref 4.0–10.5)

## 2013-09-27 LAB — URINALYSIS, ROUTINE W REFLEX MICROSCOPIC
Bilirubin Urine: NEGATIVE
Glucose, UA: >1000 mg/dL — AB
Hgb urine dipstick: NEGATIVE
KETONES UR: NEGATIVE mg/dL
LEUKOCYTES UA: NEGATIVE
NITRITE: NEGATIVE
PH: 5.5 (ref 5.0–8.0)
Protein, ur: NEGATIVE mg/dL
SPECIFIC GRAVITY, URINE: 1.028 (ref 1.005–1.030)
Urobilinogen, UA: 0.2 mg/dL (ref 0.0–1.0)

## 2013-09-27 LAB — COMPREHENSIVE METABOLIC PANEL
ALBUMIN: 4.4 g/dL (ref 3.5–5.2)
ALT: 44 U/L (ref 0–53)
AST: 38 U/L — AB (ref 0–37)
Alkaline Phosphatase: 124 U/L — ABNORMAL HIGH (ref 39–117)
BUN: 10 mg/dL (ref 6–23)
CALCIUM: 9.8 mg/dL (ref 8.4–10.5)
CO2: 26 meq/L (ref 19–32)
Chloride: 100 mEq/L (ref 96–112)
Creatinine, Ser: 0.8 mg/dL (ref 0.50–1.35)
GFR calc Af Amer: >90 mL/min (ref >90)
GFR calc non Af Amer: >90 mL/min (ref >90)
Glucose, Bld: 261 mg/dL — ABNORMAL HIGH (ref 70–99)
Potassium: 3.6 mEq/L — ABNORMAL LOW (ref 3.7–5.3)
SODIUM: 140 meq/L (ref 137–147)
TOTAL PROTEIN: 7.7 g/dL (ref 6.0–8.3)
Total Bilirubin: 1.3 mg/dL — ABNORMAL HIGH (ref 0.3–1.2)

## 2013-09-27 LAB — URINE MICROSCOPIC-ADD ON

## 2013-09-27 LAB — LIPASE, BLOOD: Lipase: 5 U/L — ABNORMAL LOW (ref 11–59)

## 2013-09-27 MED ORDER — MORPHINE SULFATE 4 MG/ML IJ SOLN
4.0000 mg | Freq: Once | INTRAMUSCULAR | Status: DC
  Filled 2013-09-27: qty 1

## 2013-09-27 MED ORDER — HYDROMORPHONE HCL PF 1 MG/ML IJ SOLN
1.0000 mg | Freq: Once | INTRAMUSCULAR | Status: AC
  Administered 2013-09-27: 1 mg via INTRAVENOUS
  Filled 2013-09-27: qty 1

## 2013-09-27 MED ORDER — ONDANSETRON HCL 4 MG/2ML IJ SOLN
4.0000 mg | Freq: Once | INTRAMUSCULAR | Status: AC
  Administered 2013-09-27: 4 mg via INTRAVENOUS
  Filled 2013-09-27: qty 2

## 2013-09-27 MED ORDER — IOHEXOL 300 MG/ML  SOLN
100.0000 mL | Freq: Once | INTRAMUSCULAR | Status: AC | PRN
  Administered 2013-09-28: 100 mL via INTRAVENOUS

## 2013-09-27 MED ORDER — IOHEXOL 300 MG/ML  SOLN
50.0000 mL | Freq: Once | INTRAMUSCULAR | Status: AC | PRN
  Administered 2013-09-28: 50 mL via ORAL

## 2013-09-27 NOTE — ED Notes (Signed)
EDPA Raynelle FanningJulie in to see patient, protocols has been started

## 2013-09-27 NOTE — ED Notes (Addendum)
Pt reports upper quad abd pain with diarrhea and n no vomiting.  Increasing gas.  Wife has had gi bug recently.

## 2013-09-28 MED ORDER — PANCRELIPASE (LIP-PROT-AMYL) 20000-68000 UNITS PO CPEP: 2.0000 | ORAL_CAPSULE | Freq: Three times a day (TID) | ORAL | Status: AC | PRN

## 2013-09-28 MED ORDER — HYDROMORPHONE HCL PF 1 MG/ML IJ SOLN
INTRAMUSCULAR | Status: AC
  Administered 2013-09-28: 1 mg
  Filled 2013-09-28: qty 1

## 2013-09-28 MED ORDER — OXYCODONE HCL 5 MG PO TABS: 5.0000 mg | ORAL_TABLET | ORAL | Status: DC | PRN

## 2013-09-28 NOTE — ED Provider Notes (Signed)
Medical screening examination/treatment/procedure(s) were performed by non-physician practitioner and as supervising physician I was immediately available for consultation/collaboration.   EKG Interpretation None        Loren Raceravid Husayn Reim, MD 09/28/13 [phone]

## 2013-09-28 NOTE — ED Provider Notes (Addendum)
CSN: [id number]     Arrival date & time 09/27/13  2054 History   First MD Initiated Contact with Patient 09/27/13 2149     Chief Complaint  Patient presents with  . Abdominal Pain     (Consider location/radiation/quality/duration/timing/severity/associated sxs/prior Treatment) HPI Comments: Curtis Spears is a 43 y.o. Male with a history of chronic pancreatitis with a 1 week history of left upper abdominal pain along with nausea, but no vomiting and increased amounts of flatulence which gives temporary improvement but not complete relief of pain.  He reports having one loose, greasy stool this am which is his norm,  And 2 smaller formed stools throughout today. He has had no fevers, chest pain, shortness of breath, dysuria, back or flank pain.  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 Carlis Abbott with [facility] who continues to follow him for this condition.  He describes having problems with formation of pancreatic ductal stones and is concerned this may be the source of his pain, although denies increased gas with prior episodes.  He also has a history of renal stones, but denies dysuria and hematuria. He has found no alleviators for his pain. He is taking his creon and other home medicines as listed.     The history is provided by the patient.    Past Medical History  Diagnosis Date  . Pancreatitis   . Diabetes mellitus without complication   . Back pain     degenerative disc  . Kidney stone    Past Surgical History  Procedure Laterality Date  . Ercp      Multiple  . Pancreas surgery     Family History  Problem Relation Age of Onset  . Pancreatitis Father    History  Substance Use Topics  . Smoking status: Former Smoker    Types: Cigarettes    Quit date: 01/19/2011  . Smokeless tobacco: Not on file  . Alcohol Use: No    Review of Systems  Constitutional: Negative for fever.   HENT: Negative for congestion and sore throat.   Eyes: Negative.   Respiratory: Negative for chest tightness and shortness of breath.   Cardiovascular: Negative for chest pain.  Gastrointestinal: Negative for nausea and abdominal pain.  Genitourinary: Negative.   Musculoskeletal: Negative for arthralgias, joint swelling and neck pain.  Skin: Negative.  Negative for rash and wound.  Neurological: Negative for dizziness, weakness, light-headedness, numbness and headaches.  Psychiatric/Behavioral: Negative.       Allergies  Penicillins  Home Medications   Prior to Admission medications   Medication Sig Start Date End Date Taking? Authorizing Provider  Calcium Carb-Cholecalciferol (CALTRATE 600+D) 600-800 MG-UNIT TABS Take 1 tablet by mouth daily.    Historical Provider, MD  DULoxetine (CYMBALTA) 30 MG capsule Take 30 mg by mouth daily.    Historical Provider, MD  ibuprofen (ADVIL,MOTRIN) 600 MG tablet Take 1 tablet (600 mg total) by mouth every 6 (six) hours as needed for pain. 06/05/12   Julianne Rice, MD  Insulin Infusion Pump KIT by Does not apply route.    Historical Provider, MD  metFORMIN (GLUCOPHAGE) 1000 MG tablet Take 1,000 mg by mouth 2 (two) times daily with a meal.    Historical Provider, MD  Multiple Vitamin (MULTIVITAMIN WITH MINERALS) TABS Take 1 tablet by mouth once a week. thursday    Historical Provider, MD  ondansetron (ZOFRAN) 4 MG tablet Take 1 tablet (  4 mg total) by mouth every 6 (six) hours as needed for nausea. 04/19/12   Thurnell Lose, MD  ondansetron (ZOFRAN) 4 MG tablet Take 1 tablet (4 mg total) by mouth every 6 (six) hours. 06/05/12   Julianne Rice, MD  oxyCODONE (OXY IR/ROXICODONE) 5 MG immediate release tablet Take 1-2 tablets (5-10 mg total) by mouth every 4 (four) hours as needed for pain. 04/19/12   Thurnell Lose, MD  oxyCODONE-acetaminophen (PERCOCET/ROXICET) 5-325 MG per tablet Take 1 tablet by mouth every 4 (four) hours as needed for severe  pain. 05/31/13   Idalia Needle. Sanford, PA-C  Pancrelipase, Lip-Prot-Amyl, (CREON) 24000 UNITS CPEP Take 2 capsules by mouth 4 (four) times daily.    Historical Provider, MD  silver sulfADIAZINE (SILVADENE) 1 % cream Apply 1 application topically daily.    Historical Provider, MD  silver sulfADIAZINE (SILVADENE) 1 % cream Apply topically 2 (two) times daily. 05/31/13   Idalia Needle. Sanford, PA-C  Sulfamethoxazole-Trimethoprim (SEPTRA PO) Take by mouth.    Historical Provider, MD  Tamsulosin HCl (FLOMAX) 0.4 MG CAPS Take 1 capsule (0.4 mg total) by mouth daily. 06/05/12   Julianne Rice, MD   BP 127/97  Pulse 87  Temp(Src) 98.4 F (36.9 C) (Oral)  Resp 18  Ht 5' 11"  (1.803 m)  Wt 200 lb (90.719 kg)  BMI 27.91 kg/m2  SpO2 98% Physical Exam  Nursing note and vitals reviewed. Constitutional: He appears well-developed and well-nourished.  HENT:  Head: Normocephalic and atraumatic.  Eyes: Conjunctivae are normal.  Neck: Normal range of motion.  Cardiovascular: Normal rate, regular rhythm, normal heart sounds and intact distal pulses.   Pulmonary/Chest: Effort normal and breath sounds normal. He has no wheezes.  Abdominal: Soft. Bowel sounds are normal. There is no hepatosplenomegaly. There is tenderness in the left upper quadrant. There is no rebound, no guarding and no CVA tenderness.  Musculoskeletal: Normal range of motion.  Neurological: He is alert.  Skin: Skin is warm and dry.  Psychiatric: He has a normal mood and affect.    ED Course  Procedures (including critical care time) Labs Review Labs Reviewed  URINALYSIS, ROUTINE W REFLEX MICROSCOPIC - Abnormal; Notable for the following:    Glucose, UA >1000 (*)    All other components within normal limits  COMPREHENSIVE METABOLIC PANEL - Abnormal; Notable for the following:    Potassium 3.6 (*)    Glucose, Bld 261 (*)    AST 38 (*)    Alkaline Phosphatase 124 (*)    Total Bilirubin 1.3 (*)    All other components within normal  limits  LIPASE, BLOOD - Abnormal; Notable for the following:    Lipase 5 (*)    All other components within normal limits  URINE MICROSCOPIC-ADD ON  CBC WITH DIFFERENTIAL    Imaging Review Ct Abdomen Pelvis W Contrast  09/28/2013   CLINICAL DATA:  Epigastric pain and nausea. History of pancreatitis and renal stones.  EXAM: CT ABDOMEN AND PELVIS WITH CONTRAST  TECHNIQUE: Multidetector CT imaging of the abdomen and pelvis was performed using the standard protocol following bolus administration of intravenous contrast.  CONTRAST:  71m OMNIPAQUE IOHEXOL 300 MG/ML SOLN, 1058mOMNIPAQUE IOHEXOL 300 MG/ML SOLN  COMPARISON:  CT ABD/PELV WO CM dated 06/05/2012  FINDINGS: Calcified granuloma in the right lung base.  The liver, spleen, adrenal glands, kidneys, abdominal aorta, inferior vena cava, and retroperitoneal lymph nodes are unremarkable. Surgical absence of the gallbladder. The pancreas is diffusely fatty atrophied. No inflammatory changes  or fluid collections demonstrated around the pancreas. Multiple large venous collateral vessels anterior to the stomach and in the left upper quadrant. This appears to demonstrate shunt vascularity from the splenic vein to the mesenteric/ portal veins. Visualized mesenteric and portal veins appear patent. 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 The stomach and small bowel are not abnormally distended and no wall thickening is identified. Diffusely stool-filled colon without distention. No free air or free fluid in the abdomen.  Pelvis: Prostate gland is not enlarged. Bladder wall is not thickened. No evidence of diverticulitis. Appendix is normal. No pelvic mass or lymphadenopathy. No free or loculated pelvic fluid collections.  IMPRESSION: No acute process demonstrated in the abdomen or pelvis. There are multiple upper abdominal venous collaterals, appearing to extend from the splenic vein to the portal/mesenteric veins. 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 Pancreas is atrophied without evidence of acute pancreatitis currently.   Electronically Signed   By: Lucienne Capers M.D.   On: 09/28/2013 00:37     EKG Interpretation None      MDM   Final diagnoses:  Abdominal pain    Patients labs and/or radiological studies were viewed and considered during the medical decision making and disposition process.   at reexam patient was symptom free and comfortable.  Upon further discussion of his medications, he states he has not taken his Creon in months as it was not working.  Prior to that he was taking Zenpep which offered some relief which stopped working after [employer].  He is scheduled to f/u with his GI specialist at [facility] early June. He was advised to contact his office tomorrow, letting him know about his stable Ct scan (states was supposed to get one next month) which may be deferred per Dr Anell Barr discretion.  Prescribed a few oxycodone for pain relief also.  Advised close f/u for any worsened pain, vomiting,  Fever.    The patient appears reasonably screened and/or stabilized for discharge and I doubt any other medical condition or other [facility] requiring further screening, evaluation, or treatment in the ED at this time prior to discharge.  Review of Ct scan performed June 2014 at [facility] - unchanged with chronic splenic vein thrombosis with collaterals at that time.    Evalee Jefferson, PA-C 09/28/13 0144  Evalee Jefferson, PA-C 10/06/13 2204

## 2013-09-28 NOTE — Discharge Instructions (Signed)
Abdominal Pain, Adult Many things can cause abdominal pain. Usually, abdominal pain is not caused by a disease and will improve without treatment. It can often be observed and treated at home. Your health care provider will do a physical exam and possibly order blood tests and X-rays to help determine the seriousness of your pain. However, in many cases, more time must pass before a clear cause of the pain can be found. Before that point, your health care provider may not know if you need more testing or further treatment. HOME CARE INSTRUCTIONS  Monitor your abdominal pain for any changes. The following actions may help to alleviate any discomfort you are experiencing:  Only take over-the-counter or prescription medicines as directed by your health care provider.  Do not take laxatives unless directed to do so by your health care provider.  Try a clear liquid diet (broth, tea, or water) as directed by your health care provider. Slowly move to a bland diet as tolerated. SEEK MEDICAL CARE IF:  You have unexplained abdominal pain.  You have abdominal pain associated with nausea or diarrhea.  You have pain when you urinate or have a bowel movement.  You experience abdominal pain that wakes you in the night.  You have abdominal pain that is worsened or improved by eating food.  You have abdominal pain that is worsened with eating fatty foods. SEEK IMMEDIATE MEDICAL CARE IF:   Your pain does not go away within 2 hours.  You have a fever.  You keep throwing up (vomiting).  Your pain is felt only in portions of the abdomen, such as the right side or the left lower portion of the abdomen.  You pass bloody or black tarry stools. MAKE SURE YOU:  Understand these instructions.   Will watch your condition.   Will get help right away if you are not doing well or get worse.  Document Released: 02/13/2005 Document Revised: 02/24/2013 Document Reviewed: 01/13/2013 [facility] Patient  Information 2014 [employer] [place].   Start taking your Zenpep as discussed.  You may take the oxycodone prescribed for pain relief.  This will make you drowsy - do not drive within 4 hours of taking this medication.  Call Dr. Burna Fortslarks office today letting him know of your increased pain and your stable lab tests and CT scans performed tonight.  He may want to recheck you in his office if your symptoms persist.  Also, get  rechecked for any worsened symptoms including uncontrolled vomiting or development of fevers.

## 2013-10-07 NOTE — ED Provider Notes (Signed)
Medical screening examination/treatment/procedure(s) were performed by non-physician practitioner and as supervising physician I was immediately available for consultation/collaboration.   EKG Interpretation None        Burhanuddin Kohlmann, MD 10/07/13 0559 

## 2014-04-28 ENCOUNTER — Emergency Department (HOSPITAL_BASED_OUTPATIENT_CLINIC_OR_DEPARTMENT_OTHER): Payer: 59

## 2014-04-28 ENCOUNTER — Encounter (HOSPITAL_BASED_OUTPATIENT_CLINIC_OR_DEPARTMENT_OTHER): Payer: Self-pay | Admitting: Emergency Medicine

## 2014-04-28 ENCOUNTER — Emergency Department (HOSPITAL_BASED_OUTPATIENT_CLINIC_OR_DEPARTMENT_OTHER)
Admission: EM | Admit: 2014-04-28 | Discharge: 2014-04-28 | Disposition: A | Discharge status: Home / Self Care | Payer: 59 | Attending: Emergency Medicine | Admitting: Emergency Medicine

## 2014-04-28 DIAGNOSIS — Z794 Long term (current) use of insulin: Secondary | ICD-10-CM | POA: Insufficient documentation

## 2014-04-28 DIAGNOSIS — R109 Unspecified abdominal pain: Secondary | ICD-10-CM | POA: Diagnosis present

## 2014-04-28 DIAGNOSIS — Z87891 Personal history of nicotine dependence: Secondary | ICD-10-CM | POA: Diagnosis not present

## 2014-04-28 DIAGNOSIS — Z79899 Other long term (current) drug therapy: Secondary | ICD-10-CM | POA: Insufficient documentation

## 2014-04-28 DIAGNOSIS — Z880 Allergy status to penicillin: Secondary | ICD-10-CM | POA: Insufficient documentation

## 2014-04-28 DIAGNOSIS — E119 Type 2 diabetes mellitus without complications: Secondary | ICD-10-CM | POA: Insufficient documentation

## 2014-04-28 DIAGNOSIS — K861 Other chronic pancreatitis: Secondary | ICD-10-CM | POA: Insufficient documentation

## 2014-04-28 DIAGNOSIS — Z87442 Personal history of urinary calculi: Secondary | ICD-10-CM | POA: Diagnosis not present

## 2014-04-28 LAB — CBC WITH DIFFERENTIAL/PLATELET
Basophils Absolute: 0 10*3/uL (ref 0.0–0.1)
Basophils Relative: 0 % (ref 0–1)
EOS ABS: 0.2 10*3/uL (ref 0.0–0.7)
EOS PCT: 2 % (ref 0–5)
HEMATOCRIT: 42.4 % (ref 39.0–52.0)
HEMOGLOBIN: 14.4 g/dL (ref 13.0–17.0)
LYMPHS ABS: 1.2 10*3/uL (ref 0.7–4.0)
Lymphocytes Relative: 12 % (ref 12–46)
MCH: 30.1 pg (ref 26.0–34.0)
MCHC: 34 g/dL (ref 30.0–36.0)
MCV: 88.5 fL (ref 78.0–100.0)
MONOS PCT: 6 % (ref 3–12)
Monocytes Absolute: 0.6 10*3/uL (ref 0.1–1.0)
Neutro Abs: 7.5 10*3/uL (ref 1.7–7.7)
Neutrophils Relative %: 80 % — ABNORMAL HIGH (ref 43–77)
Platelets: 169 10*3/uL (ref 150–400)
RBC: 4.79 MIL/uL (ref 4.22–5.81)
RDW: 12.3 % (ref 11.5–15.5)
WBC: 9.5 10*3/uL (ref 4.0–10.5)

## 2014-04-28 LAB — COMPREHENSIVE METABOLIC PANEL
ALK PHOS: 88 U/L (ref 39–117)
ALT: 27 U/L (ref 0–53)
ANION GAP: 13 (ref 5–15)
AST: 23 U/L (ref 0–37)
Albumin: 3.8 g/dL (ref 3.5–5.2)
BUN: 17 mg/dL (ref 6–23)
CALCIUM: 8.9 mg/dL (ref 8.4–10.5)
CO2: 27 meq/L (ref 19–32)
Chloride: 101 mEq/L (ref 96–112)
Creatinine, Ser: 0.9 mg/dL (ref 0.50–1.35)
GLUCOSE: 328 mg/dL — AB (ref 70–99)
Potassium: 4.5 mEq/L (ref 3.7–5.3)
Sodium: 141 mEq/L (ref 137–147)
TOTAL PROTEIN: 6.9 g/dL (ref 6.0–8.3)
Total Bilirubin: 1.2 mg/dL (ref 0.3–1.2)

## 2014-04-28 LAB — LIPASE, BLOOD: Lipase: 5 U/L — ABNORMAL LOW (ref 11–59)

## 2014-04-28 MED ORDER — IOHEXOL 300 MG/ML  SOLN
100.0000 mL | Freq: Once | INTRAMUSCULAR | Status: AC | PRN
  Administered 2014-04-28: 100 mL via INTRAVENOUS

## 2014-04-28 MED ORDER — ONDANSETRON HCL 4 MG/2ML IJ SOLN
4.0000 mg | Freq: Once | INTRAMUSCULAR | Status: AC
  Administered 2014-04-28: 4 mg via INTRAVENOUS
  Filled 2014-04-28: qty 2

## 2014-04-28 MED ORDER — HYDROMORPHONE HCL 1 MG/ML IJ SOLN
2.0000 mg | Freq: Once | INTRAMUSCULAR | Status: AC
  Administered 2014-04-28: 2 mg via INTRAVENOUS
  Filled 2014-04-28: qty 2

## 2014-04-28 MED ORDER — HYDROMORPHONE HCL 1 MG/ML IJ SOLN
1.0000 mg | Freq: Once | INTRAMUSCULAR | Status: AC
Start: 2014-04-28 — End: 2014-04-28
  Administered 2014-04-28: 1 mg via INTRAVENOUS
  Filled 2014-04-28: qty 1

## 2014-04-28 MED ORDER — MORPHINE SULFATE 4 MG/ML IJ SOLN
4.0000 mg | Freq: Once | INTRAMUSCULAR | Status: DC
  Filled 2014-04-28: qty 1

## 2014-04-28 MED ORDER — SODIUM CHLORIDE 0.9 % IV BOLUS (SEPSIS)
1000.0000 mL | Freq: Once | INTRAVENOUS | Status: AC
  Administered 2014-04-28: 1000 mL via INTRAVENOUS

## 2014-04-28 MED ORDER — HYDROMORPHONE HCL 1 MG/ML IJ SOLN
1.0000 mg | Freq: Once | INTRAMUSCULAR | Status: AC
  Administered 2014-04-28: 1 mg via INTRAVENOUS
  Filled 2014-04-28: qty 1

## 2014-04-28 MED ORDER — IOHEXOL 300 MG/ML  SOLN
25.0000 mL | Freq: Once | INTRAMUSCULAR | Status: AC | PRN
  Administered 2014-04-28: 25 mL via ORAL

## 2014-04-28 NOTE — ED Notes (Signed)
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 No N/V/D at this time.

## 2014-04-28 NOTE — ED Notes (Signed)
Took in pts morphine per order and pt stated "that doesn't work for me, they always have to give me dilaudid."  PA notified.

## 2014-04-28 NOTE — ED Notes (Signed)
Patient transported to CT 

## 2014-04-28 NOTE — ED Notes (Signed)
PA at bedside.

## 2014-04-28 NOTE — ED Provider Notes (Signed)
CSN: [id number]     Arrival date & time 04/28/14  1454 History   First MD Initiated Contact with Patient 04/28/14 1524     Chief Complaint  Patient presents with  . Abdominal Pain     (Consider location/radiation/quality/duration/timing/severity/associated sxs/prior Treatment) HPI Comments: Patient is a 43 year old male with a past medical history of diabetes and chronic congenital pancreatitis who presents with chronic abdominal pain that acutely worsened since last night. The pain is located in the epigastrium and does not radiate. The pain is described as sharp and severe. The pain started gradually and progressively worsened since the onset. No alleviating/aggravating factors. The patient has tried home oxycodone and fentanyl patch for symptoms without relief. Associated symptoms include nausea. Patient denies fever, headache, vomiting, diarrhea, chest pain, SOB, dysuria, constipation. Patient is seen at [facility].    Past Medical History  Diagnosis Date  . Pancreatitis   . Diabetes mellitus without complication   . Back pain     degenerative disc  . Kidney stone    Past Surgical History  Procedure Laterality Date  . Ercp      Multiple  . Pancreas surgery     Family History  Problem Relation Age of Onset  . Pancreatitis Father    History  Substance Use Topics  . Smoking status: Former Smoker    Types: Cigarettes    Quit date: 01/19/2011  . Smokeless tobacco: Not on file  . Alcohol Use: No    Review of Systems  Constitutional: Negative for fever, chills and fatigue.  HENT: Negative for trouble swallowing.   Eyes: Negative for visual disturbance.  Respiratory: Negative for shortness of breath.   Cardiovascular: Negative for chest pain and palpitations.  Gastrointestinal: Positive for abdominal pain. Negative for nausea, vomiting and diarrhea.  Genitourinary: Negative for dysuria and difficulty urinating.  Musculoskeletal: Negative for arthralgias and neck  pain.  Skin: Negative for color change.  Neurological: Negative for dizziness and weakness.  Psychiatric/Behavioral: Negative for dysphoric mood.      Allergies  Penicillins  Home Medications   Prior to Admission medications   Medication Sig Start Date End Date Taking? Authorizing Provider  fentaNYL (DURAGESIC - DOSED MCG/HR) 50 MCG/HR Place 50 mcg onto the skin every 3 (three) days.   Yes Historical Provider, MD  Calcium Carb-Cholecalciferol (CALTRATE 600+D) 600-800 MG-UNIT TABS Take 1 tablet by mouth daily.    Historical Provider, MD  DULoxetine (CYMBALTA) 30 MG capsule Take 30 mg by mouth daily.    Historical Provider, MD  ibuprofen (ADVIL,MOTRIN) 600 MG tablet Take 1 tablet (600 mg total) by mouth every 6 (six) hours as needed for pain. 06/05/12   Julianne Rice, MD  Insulin Infusion Pump KIT by Does not apply route.    Historical Provider, MD  metFORMIN (GLUCOPHAGE) 1000 MG tablet Take 1,000 mg by mouth 2 (two) times daily with a meal.    Historical Provider, MD  Multiple Vitamin (MULTIVITAMIN WITH MINERALS) TABS Take 1 tablet by mouth once a week. thursday    Historical Provider, MD  ondansetron (ZOFRAN) 4 MG tablet Take 1 tablet (4 mg total) by mouth every 6 (six) hours as needed for nausea. 04/19/12   Thurnell Lose, MD  ondansetron (ZOFRAN) 4 MG tablet Take 1 tablet (4 mg total) by mouth every 6 (six) hours. 06/05/12   Julianne Rice, MD  oxyCODONE (OXY IR/ROXICODONE) 5 MG immediate release tablet Take 1-2 tablets (5-10 mg total) by mouth every 4 (four) hours  as needed for pain. 04/19/12   Thurnell Lose, MD  oxyCODONE (ROXICODONE) 5 MG immediate release tablet Take 1 tablet (5 mg total) by mouth every 4 (four) hours as needed for severe pain. Patient taking differently: Take 15 mg by mouth 2 (two) times daily as needed for severe pain.  09/28/13   Evalee Jefferson, PA-C  oxyCODONE-acetaminophen (PERCOCET/ROXICET) 5-325 MG per tablet Take 1 tablet by mouth every 4 (four) hours as  needed for severe pain. 05/31/13   Idalia Needle. Sanford, PA-C  Pancrelipase, Lip-Prot-Amyl, (CREON) 24000 UNITS CPEP Take 2 capsules by mouth 4 (four) times daily.    Historical Provider, MD  Pancrelipase, Lip-Prot-Amyl, 20000 UNITS CPEP Take 2 capsules (40,000 Units total) by mouth 3 (three) times daily with meals as needed. 09/28/13   Evalee Jefferson, PA-C  silver sulfADIAZINE (SILVADENE) 1 % cream Apply 1 application topically daily.    Historical Provider, MD  silver sulfADIAZINE (SILVADENE) 1 % cream Apply topically 2 (two) times daily. 05/31/13   Idalia Needle. Sanford, PA-C  Sulfamethoxazole-Trimethoprim (SEPTRA PO) Take by mouth.    Historical Provider, MD  Tamsulosin HCl (FLOMAX) 0.4 MG CAPS Take 1 capsule (0.4 mg total) by mouth daily. 06/05/12   Julianne Rice, MD   BP 131/85 mmHg  Pulse 96  Temp(Src) 99.3 F (37.4 C) (Oral)  Resp 20  Ht 5' 11" (1.803 m)  Wt 205 lb (92.987 kg)  BMI 28.60 kg/m2  SpO2 98% Physical Exam  Constitutional: He is oriented to person, place, and time. He appears well-developed and well-nourished. No distress.  HENT:  Head: Normocephalic and atraumatic.  Eyes: Conjunctivae and EOM are normal. No scleral icterus.  Neck: Normal range of motion.  Cardiovascular: Normal rate and regular rhythm.  Exam reveals no gallop and no friction rub.   No murmur heard. Pulmonary/Chest: Effort normal and breath sounds normal. He has no wheezes. He has no rales. He exhibits no tenderness.  Abdominal: Soft. He exhibits no distension. There is tenderness. There is no rebound and no guarding.  Epigastric and LUQ tenderness to palpation. No other focal tenderness.   Musculoskeletal: Normal range of motion.  Neurological: He is alert and oriented to person, place, and time. Coordination normal.  Speech is goal-oriented. Moves limbs without ataxia.   Skin: Skin is warm and dry.  Psychiatric: He has a normal mood and affect. His behavior is normal.  Nursing note and vitals  reviewed.   ED Course  Procedures (including critical care time) Labs Review Labs Reviewed  CBC WITH DIFFERENTIAL - Abnormal; Notable for the following:    Neutrophils Relative % 80 (*)    All other components within normal limits  COMPREHENSIVE METABOLIC PANEL - Abnormal; Notable for the following:    Glucose, Bld 328 (*)    All other components within normal limits  LIPASE, BLOOD - Abnormal; Notable for the following:    Lipase 5 (*)    All other components within normal limits    Imaging Review Ct Abdomen Pelvis W Contrast  04/28/2014   CLINICAL DATA:  Mid abdominal pain.  Nausea.  Pancreatitis.  EXAM: CT ABDOMEN AND PELVIS WITH CONTRAST  TECHNIQUE: Multidetector CT imaging of the abdomen and pelvis was performed using the standard protocol following bolus administration of intravenous contrast.  CONTRAST:  18m OMNIPAQUE IOHEXOL 300 MG/ML  SOLN  COMPARISON:  09/28/2013  FINDINGS: Lower Chest:  Unremarkable.  Hepatobiliary: No masses or other significant abnormality identified.  Pancreas: Atrophic but stable in appearance. No mass or  acute peripancreatic inflammatory changes identified.  Spleen:  Within normal limits in size and appearance.  Adrenal Glands:  No mass identified.  Kidneys/Urinary Tract: No masses identified. No evidence of hydronephrosis.  Stomach/Bowel/Peritoneum: No evidence of wall thickening, mass, or obstruction.  Vascular/Lymphatic: No pathologically enlarged lymph nodes identified. Chronic splenic vein thrombosis again demonstrated with prominent venous collaterals in the gastrosplenic and gastrohepatic ligaments.  Reproductive:  No mass or other significant abnormality identified.  Other:  None.  Musculoskeletal:  No suspicious bone lesions identified.  IMPRESSION: Stable exam.  No acute findings.  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   Electronically Signed   By: Earle Gell M.D.   On: 04/28/2014 18:52     EKG  Interpretation None      MDM   Final diagnoses:  Chronic pancreatitis  Abdominal pain    6:42 PM Elevated glucose. Remaining vitals stable. Patient will have CT abdomen pelvis with contrast.   7:04 PM Patient's CT unremarkable for acute changes. Patient likely having acute flare of chronic pain. Patient advised to follow up at [facility] for further evaluation. Vitals stable and patient afebrile. Patient's pain is under control.    Alvina Chou, PA-C 04/28/14 1913  Charlesetta Shanks, MD 05/03/14 1108

## 2014-04-28 NOTE — ED Notes (Signed)
Pt denies lightheaded or dizzy.  PA aware of bp, pt steady gait with family member out of ER.

## 2014-04-28 NOTE — Discharge Instructions (Signed)
[facility] with your doctor at [facility]. Return to the ED with worsening or concerning symptoms.

## 2014-05-11 ENCOUNTER — Emergency Department (HOSPITAL_BASED_OUTPATIENT_CLINIC_OR_DEPARTMENT_OTHER)
Admission: EM | Admit: 2014-05-11 | Discharge: 2014-05-11 | Disposition: A | Discharge status: Home / Self Care | Payer: 59 | Attending: Emergency Medicine | Admitting: Emergency Medicine

## 2014-05-11 ENCOUNTER — Encounter (HOSPITAL_BASED_OUTPATIENT_CLINIC_OR_DEPARTMENT_OTHER): Payer: Self-pay | Admitting: *Deleted

## 2014-05-11 DIAGNOSIS — E1165 Type 2 diabetes mellitus with hyperglycemia: Secondary | ICD-10-CM | POA: Insufficient documentation

## 2014-05-11 DIAGNOSIS — R1012 Left upper quadrant pain: Secondary | ICD-10-CM | POA: Diagnosis present

## 2014-05-11 DIAGNOSIS — Z79899 Other long term (current) drug therapy: Secondary | ICD-10-CM | POA: Diagnosis not present

## 2014-05-11 DIAGNOSIS — Z8719 Personal history of other diseases of the digestive system: Secondary | ICD-10-CM | POA: Insufficient documentation

## 2014-05-11 DIAGNOSIS — K858 Other acute pancreatitis without necrosis or infection: Secondary | ICD-10-CM

## 2014-05-11 DIAGNOSIS — K861 Other chronic pancreatitis: Secondary | ICD-10-CM | POA: Diagnosis not present

## 2014-05-11 DIAGNOSIS — Z87442 Personal history of urinary calculi: Secondary | ICD-10-CM | POA: Insufficient documentation

## 2014-05-11 DIAGNOSIS — R739 Hyperglycemia, unspecified: Secondary | ICD-10-CM

## 2014-05-11 DIAGNOSIS — Z794 Long term (current) use of insulin: Secondary | ICD-10-CM | POA: Insufficient documentation

## 2014-05-11 DIAGNOSIS — Z792 Long term (current) use of antibiotics: Secondary | ICD-10-CM | POA: Insufficient documentation

## 2014-05-11 DIAGNOSIS — R000 Tachycardia, unspecified: Secondary | ICD-10-CM | POA: Insufficient documentation

## 2014-05-11 DIAGNOSIS — Z87891 Personal history of nicotine dependence: Secondary | ICD-10-CM | POA: Insufficient documentation

## 2014-05-11 DIAGNOSIS — Z880 Allergy status to penicillin: Secondary | ICD-10-CM | POA: Insufficient documentation

## 2014-05-11 LAB — COMPREHENSIVE METABOLIC PANEL
ALT: 21 U/L (ref 0–53)
AST: 23 U/L (ref 0–37)
Albumin: 4.1 g/dL (ref 3.5–5.2)
Alkaline Phosphatase: 98 U/L (ref 39–117)
Anion gap: 8 (ref 5–15)
BUN: 16 mg/dL (ref 6–23)
CALCIUM: 8.8 mg/dL (ref 8.4–10.5)
CO2: 27 mmol/L (ref 19–32)
CREATININE: 1.17 mg/dL (ref 0.50–1.35)
Chloride: 101 mEq/L (ref 96–112)
GFR calc non Af Amer: 74 mL/min — ABNORMAL LOW (ref >90)
GFR, EST AFRICAN AMERICAN: 85 mL/min — AB (ref >90)
GLUCOSE: 386 mg/dL — AB (ref 70–99)
Potassium: 4.3 mmol/L (ref 3.5–5.1)
SODIUM: 136 mmol/L (ref 135–145)
TOTAL PROTEIN: 6.9 g/dL (ref 6.0–8.3)
Total Bilirubin: 1.5 mg/dL — ABNORMAL HIGH (ref 0.3–1.2)

## 2014-05-11 LAB — CBC WITH DIFFERENTIAL/PLATELET
BASOS ABS: 0 10*3/uL (ref 0.0–0.1)
Basophils Relative: 0 % (ref 0–1)
EOS PCT: 2 % (ref 0–5)
Eosinophils Absolute: 0.2 10*3/uL (ref 0.0–0.7)
HCT: 42.4 % (ref 39.0–52.0)
Hemoglobin: 14.4 g/dL (ref 13.0–17.0)
LYMPHS ABS: 1.5 10*3/uL (ref 0.7–4.0)
LYMPHS PCT: 20 % (ref 12–46)
MCH: 30.4 pg (ref 26.0–34.0)
MCHC: 34 g/dL (ref 30.0–36.0)
MCV: 89.5 fL (ref 78.0–100.0)
Monocytes Absolute: 0.5 10*3/uL (ref 0.1–1.0)
Monocytes Relative: 6 % (ref 3–12)
Neutro Abs: 5.5 10*3/uL (ref 1.7–7.7)
Neutrophils Relative %: 72 % (ref 43–77)
Platelets: 167 10*3/uL (ref 150–400)
RBC: 4.74 MIL/uL (ref 4.22–5.81)
RDW: 12.1 % (ref 11.5–15.5)
WBC: 7.6 10*3/uL (ref 4.0–10.5)

## 2014-05-11 LAB — LIPASE, BLOOD: Lipase: 17 U/L (ref 11–59)

## 2014-05-11 MED ORDER — HYDROMORPHONE HCL 1 MG/ML IJ SOLN
2.0000 mg | Freq: Once | INTRAMUSCULAR | Status: AC
Start: 2014-05-11 — End: 2014-05-11
  Administered 2014-05-11: 2 mg via INTRAVENOUS
  Filled 2014-05-11: qty 2

## 2014-05-11 MED ORDER — SODIUM CHLORIDE 0.9 % IV BOLUS (SEPSIS)
1000.0000 mL | Freq: Once | INTRAVENOUS | Status: AC
  Administered 2014-05-11: 1000 mL via INTRAVENOUS

## 2014-05-11 MED ORDER — HYDROMORPHONE HCL 1 MG/ML IJ SOLN
2.0000 mg | Freq: Once | INTRAMUSCULAR | Status: AC
  Administered 2014-05-11: 2 mg via INTRAVENOUS
  Filled 2014-05-11: qty 2

## 2014-05-11 MED ORDER — OXYCODONE HCL 5 MG PO TABS: 15.0000 mg | ORAL_TABLET | Freq: Four times a day (QID) | ORAL | Status: DC | PRN

## 2014-05-11 MED ORDER — ONDANSETRON HCL 4 MG/2ML IJ SOLN
4.0000 mg | Freq: Once | INTRAMUSCULAR | Status: AC
  Administered 2014-05-11: 4 mg via INTRAVENOUS
  Filled 2014-05-11: qty 2

## 2014-05-11 MED ORDER — OXYCODONE HCL 15 MG PO TABS: 15.0000 mg | ORAL_TABLET | Freq: Four times a day (QID) | ORAL | Status: AC | PRN

## 2014-05-11 NOTE — ED Provider Notes (Signed)
CSN: [id number]     Arrival date & time 05/11/14  1725 History  This chart was scribed for Ephraim Hamburger, MD by Evelene Croon, ED Scribe. This patient was seen in room MH08/MH08 and the patient's care was started 6:18 PM.    Chief Complaint  Patient presents with  . Abdominal Pain      The history is provided by the patient. No language interpreter was used.     HPI Comments:  Curtis Spears is a 43 y.o. male with a h/o chronic, familial pancreatitis and a partial pancreatectomy who presents to the Emergency Department complaining of dull abdominal pain with associated nausea and decreased PO intake that started 3 days ago.He reports pain to his LUQ and epigastric region. He states he is having an exacerbation of his pancreatitis. He denies fever, diarrhea and vomiting. He has taken dilaudid pills with relief of the low-level/chronic pain but does not relieve the more severe pain associated with this exacerbation.Marland Kitchen He recently was admittied at [facility] for the same. He has a first appointment with pain management set for Dec 31st, 2015. He has had oxycodone 15 mg in the past and states this controlled his pain better than the po dilaudid.  Past Medical History  Diagnosis Date  . Pancreatitis   . Diabetes mellitus without complication   . Back pain     degenerative disc  . Kidney stone    Past Surgical History  Procedure Laterality Date  . Ercp      Multiple  . Pancreas surgery     Family History  Problem Relation Age of Onset  . Pancreatitis Father    History  Substance Use Topics  . Smoking status: Former Smoker    Types: Cigarettes    Quit date: 01/19/2011  . Smokeless tobacco: Not on file  . Alcohol Use: No    Review of Systems  Constitutional: Positive for appetite change. Negative for fever and chills.  Gastrointestinal: Positive for nausea and abdominal distention. Negative for vomiting and diarrhea.  All other systems reviewed and are  negative.     Allergies  Penicillins  Home Medications   Prior to Admission medications   Medication Sig Start Date End Date Taking? Authorizing Provider  dicyclomine (BENTYL) 10 MG capsule Take 10 mg by mouth 4 (four) times daily -  before meals and at bedtime.   Yes Historical Provider, MD  docusate sodium (COLACE) 100 MG capsule Take 100 mg by mouth 2 (two) times daily.   Yes Historical Provider, MD  HYDROmorphone (DILAUDID) 2 MG tablet Take 6 mg by mouth every 4 (four) hours as needed for severe pain.   Yes Historical Provider, MD  polyethylene glycol (MIRALAX / GLYCOLAX) packet Take 17 g by mouth as needed.   Yes Historical Provider, MD  Sennosides 17.2 MG TABS Take 17.2 mg by mouth Nightly.   Yes Historical Provider, MD  Calcium Carb-Cholecalciferol (CALTRATE 600+D) 600-800 MG-UNIT TABS Take 1 tablet by mouth daily.    Historical Provider, MD  DULoxetine (CYMBALTA) 30 MG capsule Take 30 mg by mouth daily.    Historical Provider, MD  fentaNYL (DURAGESIC - DOSED MCG/HR) 50 MCG/HR Place 50 mcg onto the skin every 3 (three) days.    Historical Provider, MD  ibuprofen (ADVIL,MOTRIN) 600 MG tablet Take 1 tablet (600 mg total) by mouth every 6 (six) hours as needed for pain. 06/05/12   Julianne Rice, MD  Insulin Infusion Pump KIT by Does not apply route.  Historical Provider, MD  metFORMIN (GLUCOPHAGE) 1000 MG tablet Take 1,000 mg by mouth 2 (two) times daily with a meal.    Historical Provider, MD  Multiple Vitamin (MULTIVITAMIN WITH MINERALS) TABS Take 1 tablet by mouth once a week. thursday    Historical Provider, MD  ondansetron (ZOFRAN) 4 MG tablet Take 1 tablet (4 mg total) by mouth every 6 (six) hours as needed for nausea. 04/19/12   Thurnell Lose, MD  ondansetron (ZOFRAN) 4 MG tablet Take 1 tablet (4 mg total) by mouth every 6 (six) hours. 06/05/12   Julianne Rice, MD  oxyCODONE (OXY IR/ROXICODONE) 5 MG immediate release tablet Take 1-2 tablets (5-10 mg total) by mouth every 4  (four) hours as needed for pain. 04/19/12   Thurnell Lose, MD  oxyCODONE (ROXICODONE) 5 MG immediate release tablet Take 1 tablet (5 mg total) by mouth every 4 (four) hours as needed for severe pain. Patient taking differently: Take 15 mg by mouth 2 (two) times daily as needed for severe pain.  09/28/13   Evalee Jefferson, PA-C  Pancrelipase, Lip-Prot-Amyl, (CREON) 24000 UNITS CPEP Take 2 capsules by mouth 4 (four) times daily.    Historical Provider, MD  Pancrelipase, Lip-Prot-Amyl, 20000 UNITS CPEP Take 2 capsules (40,000 Units total) by mouth 3 (three) times daily with meals as needed. 09/28/13   Evalee Jefferson, PA-C  silver sulfADIAZINE (SILVADENE) 1 % cream Apply 1 application topically daily.    Historical Provider, MD  silver sulfADIAZINE (SILVADENE) 1 % cream Apply topically 2 (two) times daily. 05/31/13   Idalia Needle. Sanford, PA-C  Sulfamethoxazole-Trimethoprim (SEPTRA PO) Take by mouth.    Historical Provider, MD  Tamsulosin HCl (FLOMAX) 0.4 MG CAPS Take 1 capsule (0.4 mg total) by mouth daily. 06/05/12   Julianne Rice, MD   BP 105/53 mmHg  Pulse 107  Temp(Src) 98.4 F (36.9 C) (Oral)  Resp 16  Ht 5' 11"  (1.803 m)  Wt 200 lb (90.719 kg)  BMI 27.91 kg/m2  SpO2 98% Physical Exam  Constitutional: He is oriented to person, place, and time. He appears well-developed and well-nourished.  Pt is curled over and appears in a significant amount of pain, rocking back and forth   HENT:  Head: Normocephalic and atraumatic.  Eyes: Right eye exhibits no discharge. Left eye exhibits no discharge.  Neck: Neck supple.  Cardiovascular: Regular rhythm and normal heart sounds.  Tachycardia present.   Pulmonary/Chest: Effort normal and breath sounds normal. No respiratory distress.  Abdominal: He exhibits no distension. There is tenderness (Epigastric).  Well healed midline abdominal scar  Neurological: He is alert and oriented to person, place, and time.  Skin: Skin is warm and dry.  Psychiatric: He has a  normal mood and affect.  Nursing note and vitals reviewed.   ED Course  Procedures    DIAGNOSTIC STUDIES:  Oxygen Saturation is 98% on RA, normal by my interpretation.    COORDINATION OF CARE:  6:25 PM Discussed treatment plan with pt at bedside and pt agreed to plan.  Labs Review Labs Reviewed  COMPREHENSIVE METABOLIC PANEL - Abnormal; Notable for the following:    Glucose, Bld 386 (*)    Total Bilirubin 1.5 (*)    GFR calc non Af Amer 74 (*)    GFR calc Af Amer 85 (*)    All other components within normal limits  LIPASE, BLOOD  CBC WITH DIFFERENTIAL    Imaging Review No results found.   EKG Interpretation None  MDM   Final diagnoses:  Other pancreatitis  Hyperglycemia    Patient's pain appears to be an uncomplicated exacerbation of his pancreatitis. I do not feel the patient needs imaging at this time. Pain controlled after 2 doses of Dilaudid. Patient feels well to go home. We'll provide short course of oral narcotics and recommend keeping appointment with pain management. He is hyperglycemic, given IV fluids and will take his insulin as he missed the evening dose tonight.  I personally performed the services described in this documentation, which was scribed in my presence. The recorded information has been reviewed and is accurate.    Ephraim Hamburger, MD 05/12/14 [phone]

## 2014-05-11 NOTE — Discharge Instructions (Signed)
Acute Pancreatitis °Acute pancreatitis is a disease in which the pancreas becomes suddenly inflamed. The pancreas is a large gland located behind your stomach. The pancreas produces enzymes that help digest food. The pancreas also releases the hormones glucagon and insulin that help regulate blood sugar. Damage to the pancreas occurs when the digestive enzymes from the pancreas are activated and begin attacking the pancreas before being released into the intestine. Most acute attacks last a couple of days and can cause serious complications. Some people become dehydrated and develop low blood pressure. In severe cases, bleeding into the pancreas can lead to shock and can be life-threatening. The lungs, heart, and kidneys may fail. °CAUSES  °Pancreatitis can happen to anyone. In some cases, the cause is unknown. Most cases are caused by: °· Alcohol abuse. °· Gallstones. °Other less common causes are: °· Certain medicines. °· Exposure to certain chemicals. °· Infection. °· Damage caused by an accident (trauma). °· Abdominal surgery. °SYMPTOMS  °· Pain in the upper abdomen that may radiate to the back. °· Tenderness and swelling of the abdomen. °· Nausea and vomiting. °DIAGNOSIS  °Your caregiver will perform a physical exam. Blood and stool tests may be done to confirm the diagnosis. Imaging tests may also be done, such as X-rays, CT scans, or an ultrasound of the abdomen. °TREATMENT  °Treatment usually requires a stay in the hospital. Treatment may include: °· Pain medicine. °· Fluid replacement through an intravenous line (IV). °· Placing a tube in the stomach to remove stomach contents and control vomiting. °· Not eating for 3 or 4 days. This gives your pancreas a rest, because enzymes are not being produced that can cause further damage. °· Antibiotic medicines if your condition is caused by an infection. °· Surgery of the pancreas or gallbladder. °HOME CARE INSTRUCTIONS  °· Follow the diet advised by your  caregiver. This may involve avoiding alcohol and decreasing the amount of fat in your diet. °· Eat smaller, more frequent meals. This reduces the amount of digestive juices the pancreas produces. °· Drink enough fluids to keep your urine clear or pale yellow. °· Only take over-the-counter or prescription medicines as directed by your caregiver. °· Avoid drinking alcohol if it caused your condition. °· Do not smoke. °· Get plenty of rest. °· Check your blood sugar at home as directed by your caregiver. °· Keep all follow-up appointments as directed by your caregiver. °SEEK MEDICAL CARE IF:  °· You do not recover as quickly as expected. °· You develop new or worsening symptoms. °· You have persistent pain, weakness, or nausea. °· You recover and then have another episode of pain. °SEEK IMMEDIATE MEDICAL CARE IF:  °· You are unable to eat or keep fluids down. °· Your pain becomes severe. °· You have a fever or persistent symptoms for more than 2 to 3 days. °· You have a fever and your symptoms suddenly get worse. °· Your skin or the white part of your eyes turn yellow (jaundice). °· You develop vomiting. °· You feel dizzy, or you faint. °· Your blood sugar is high (over 300 mg/dL). °MAKE SURE YOU:  °· Understand these instructions. °· Will watch your condition. °· Will get help right away if you are not doing well or get worse. °Document Released: 05/06/2005 Document Revised: 11/05/2011 Document Reviewed: 08/15/2011 °Exit[employer]® Patient Information ©2015 [employer]. This information is not intended to replace advice given to you by your health care provider. Make sure you discuss any questions you have   with your health care provider. ° °Abdominal Pain °Many things can cause abdominal pain. Usually, abdominal pain is not caused by a disease and will improve without treatment. It can often be observed and treated at home. Your health care provider will do a physical exam and possibly order blood tests and X-rays to help  determine the seriousness of your pain. However, in many cases, more time must pass before a clear cause of the pain can be found. Before that point, your health care provider may not know if you need more testing or further treatment. °HOME CARE INSTRUCTIONS  °Monitor your abdominal pain for any changes. The following actions may help to alleviate any discomfort you are experiencing: °· Only take over-the-counter or prescription medicines as directed by your health care provider. °· Do not take laxatives unless directed to do so by your health care provider. °· Try a clear liquid diet (broth, tea, or water) as directed by your health care provider. Slowly move to a bland diet as tolerated. °SEEK MEDICAL CARE IF: °· You have unexplained abdominal pain. °· You have abdominal pain associated with nausea or diarrhea. °· You have pain when you urinate or have a bowel movement. °· You experience abdominal pain that wakes you in the night. °· You have abdominal pain that is worsened or improved by eating food. °· You have abdominal pain that is worsened with eating fatty foods. °· You have a fever. °SEEK IMMEDIATE MEDICAL CARE IF:  °· Your pain does not go away within 2 hours. °· You keep throwing up (vomiting). °· Your pain is felt only in portions of the abdomen, such as the right side or the left lower portion of the abdomen. °· You pass bloody or black tarry stools. °MAKE SURE YOU: °· Understand these instructions.   °· Will watch your condition.   °· Will get help right away if you are not doing well or get worse.   °Document Released: 02/13/2005 Document Revised: 05/11/2013 Document Reviewed: 01/13/2013 °ExitCare® Patient Information ©2015 [employer]. This information is not intended to replace advice given to you by your health care provider. Make sure you discuss any questions you have with your health care provider. ° °

## 2014-05-11 NOTE — ED Notes (Signed)
Pt c/o abd pain 3 days, HX chronic pancreatitis recent admission to [facility], dilaudid tabs not working

## 2014-05-11 NOTE — ED Notes (Signed)
Pt ambulating independently w/ steady gait on d/c in no acute distress, A&Ox4. D/c instructions reviewed w/ pt - pt denies any further questions or concerns at present. Rx given x1  

## 2014-11-25 IMAGING — CR DG CHEST 1V PORT
1 series · 1 of 1 positions shown · non-contrast
Comparison: 04/14/2012; abdominal radiographic series - 04/13/2012

CLINICAL DATA: Abdominal pain, shortness of breath, atelectasis

PORTABLE CHEST - 1 VIEW

[AP]
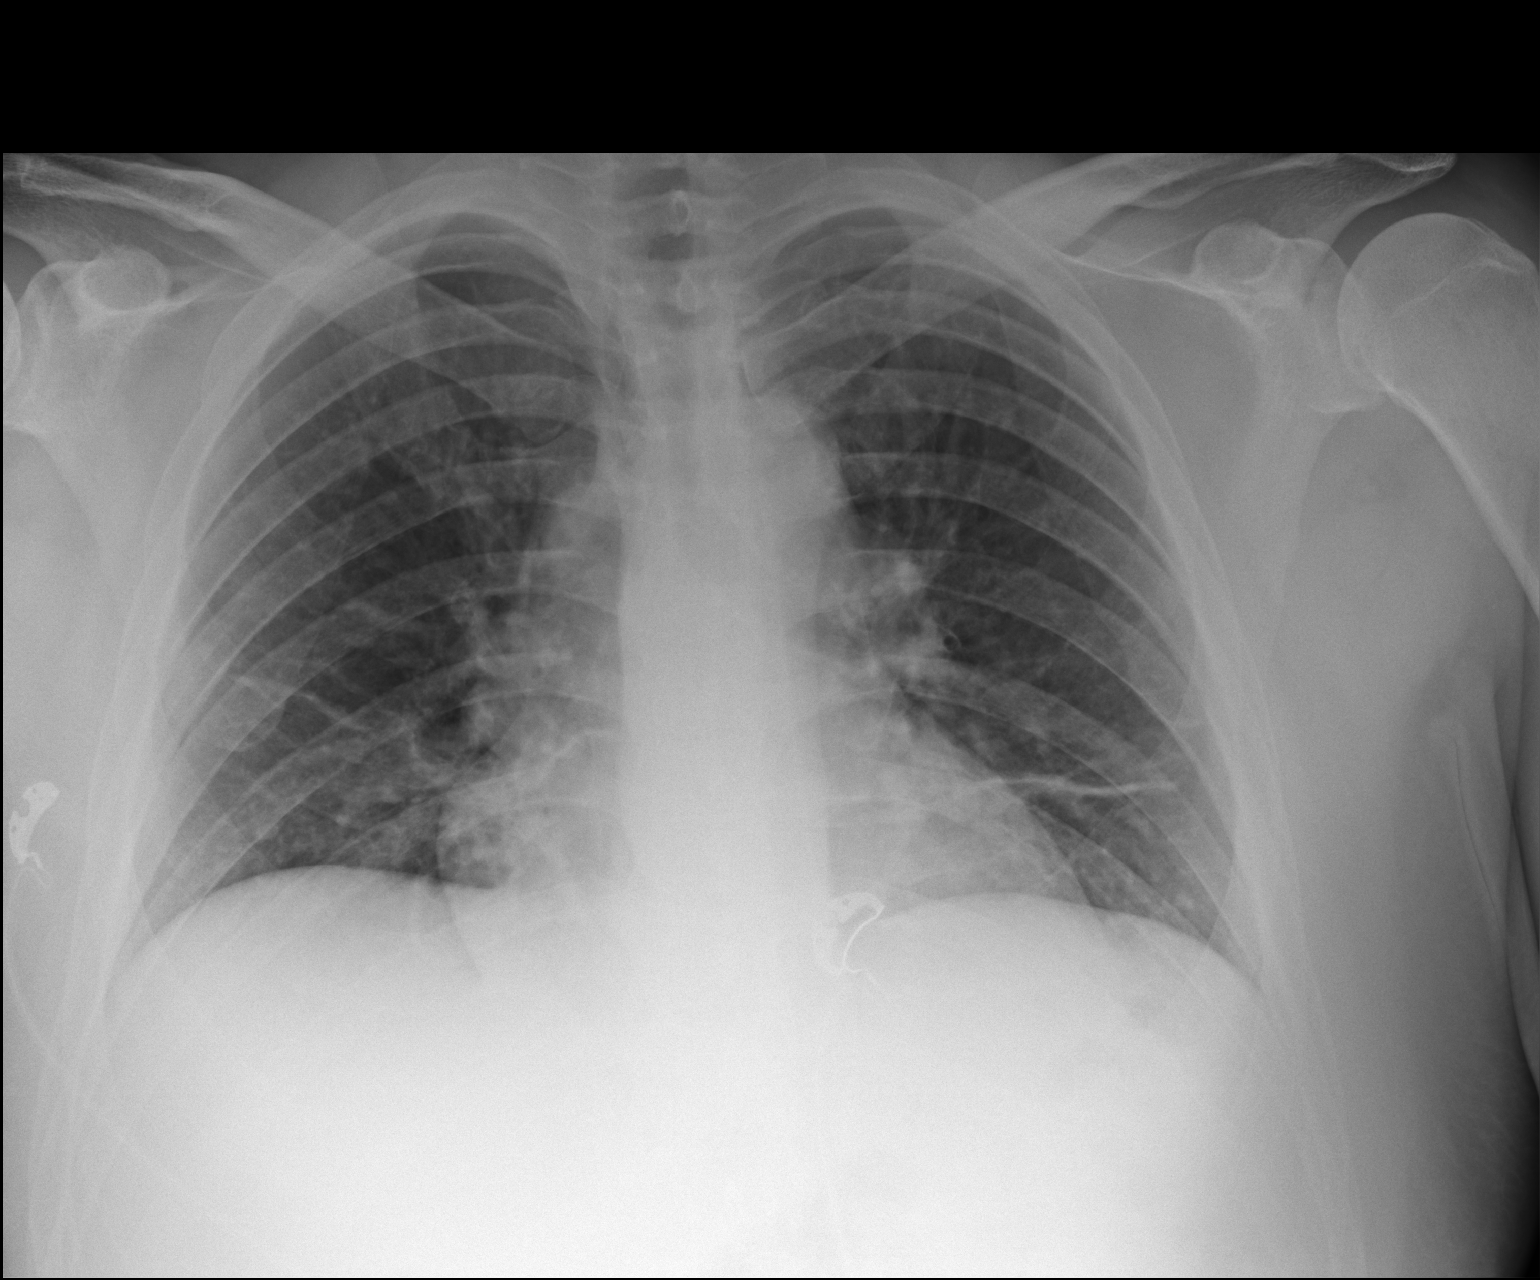

[1 of 1 positions shown; findings below may reference images not displayed]

FINDINGS: Grossly unchanged enlarged cardiac silhouette and mediastinal
contours.  Lung volumes remain reduced with persistent bilateral
mid and lower lung linear heterogeneous opacities.  Mild pulmonary
venous congestion without frank evidence of edema.  No definite
pneumothorax or pleural effusion.  Unchanged bones.
IMPRESSION: 1.  Persistently reduced lung volumes with mid and lower lung
atelectasis. Further evaluation with a PA and lateral chest
radiograph may be obtained as clinically indicated.
2.  Pulmonary venous congestion without frank evidence of edema.

## 2017-10-16 ENCOUNTER — Encounter (HOSPITAL_BASED_OUTPATIENT_CLINIC_OR_DEPARTMENT_OTHER): Payer: Self-pay

## 2017-10-16 ENCOUNTER — Other Ambulatory Visit: Payer: Self-pay

## 2017-10-16 ENCOUNTER — Emergency Department (HOSPITAL_BASED_OUTPATIENT_CLINIC_OR_DEPARTMENT_OTHER)
Admission: EM | Admit: 2017-10-16 | Discharge: 2017-10-16 | Disposition: A | Discharge status: Home / Self Care | Payer: Self-pay | Attending: Emergency Medicine | Admitting: Emergency Medicine

## 2017-10-16 DIAGNOSIS — K0889 Other specified disorders of teeth and supporting structures: Secondary | ICD-10-CM | POA: Insufficient documentation

## 2017-10-16 DIAGNOSIS — Z5321 Procedure and treatment not carried out due to patient leaving prior to being seen by health care provider: Secondary | ICD-10-CM | POA: Insufficient documentation

## 2017-10-16 NOTE — ED Triage Notes (Signed)
Pt c/o left side dental pain and swelling after tooth extraction last week, worsening over the last three days, has not contacted dentist, f/u appointment on tuesday
